# Patient Record
Sex: Female | Born: 1960
Health system: Southern US, Community
[De-identification: ages and names within clinical notes are randomized; demographics above are authoritative.]

## PROBLEM LIST (undated history)

## (undated) DIAGNOSIS — E079 Disorder of thyroid, unspecified: Secondary | ICD-10-CM

## (undated) DIAGNOSIS — F329 Major depressive disorder, single episode, unspecified: Secondary | ICD-10-CM

## (undated) DIAGNOSIS — T7840XA Allergy, unspecified, initial encounter: Secondary | ICD-10-CM

## (undated) DIAGNOSIS — D509 Iron deficiency anemia, unspecified: Secondary | ICD-10-CM

## (undated) DIAGNOSIS — G8929 Other chronic pain: Secondary | ICD-10-CM

## (undated) DIAGNOSIS — F411 Generalized anxiety disorder: Secondary | ICD-10-CM

## (undated) DIAGNOSIS — R195 Other fecal abnormalities: Secondary | ICD-10-CM

## (undated) DIAGNOSIS — M549 Dorsalgia, unspecified: Secondary | ICD-10-CM

## (undated) DIAGNOSIS — F17200 Nicotine dependence, unspecified, uncomplicated: Secondary | ICD-10-CM

## (undated) DIAGNOSIS — S8290XA Unspecified fracture of unspecified lower leg, initial encounter for closed fracture: Secondary | ICD-10-CM

## (undated) DIAGNOSIS — E785 Hyperlipidemia, unspecified: Secondary | ICD-10-CM

## (undated) HISTORY — DX: Hyperlipidemia, unspecified: E78.5

## (undated) HISTORY — DX: Nicotine dependence, unspecified, uncomplicated: F17.200

## (undated) HISTORY — DX: Disorder of thyroid, unspecified: E07.9

## (undated) HISTORY — DX: Allergy, unspecified, initial encounter: T78.40XA

## (undated) HISTORY — PX: COLONOSCOPY: SHX174

## (undated) HISTORY — DX: Iron deficiency anemia, unspecified: D50.9

## (undated) HISTORY — DX: Unspecified fracture of unspecified lower leg, initial encounter for closed fracture: S82.90XA

---
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
## 1987-11-19 HISTORY — PX: OTHER SURGICAL HISTORY: SHX169

## 1997-11-18 HISTORY — PX: OTHER SURGICAL HISTORY: SHX169

## 1998-07-25 ENCOUNTER — Observation Stay (HOSPITAL_COMMUNITY): Admission: EM | Admit: 1998-07-25 | Discharge: 1998-07-26 | Payer: Self-pay | Admitting: Emergency Medicine

## 2002-08-01 ENCOUNTER — Emergency Department (HOSPITAL_COMMUNITY): Admission: EM | Admit: 2002-08-01 | Discharge: 2002-08-01 | Payer: Self-pay | Admitting: *Deleted

## 2003-01-21 ENCOUNTER — Other Ambulatory Visit: Admission: RE | Admit: 2003-01-21 | Discharge: 2003-01-21 | Payer: Self-pay | Admitting: Obstetrics and Gynecology

## 2003-02-10 ENCOUNTER — Ambulatory Visit (HOSPITAL_COMMUNITY): Admission: RE | Admit: 2003-02-10 | Discharge: 2003-02-10 | Payer: Self-pay | Admitting: Family Medicine

## 2003-02-10 ENCOUNTER — Encounter: Payer: Self-pay | Admitting: Family Medicine

## 2003-03-17 ENCOUNTER — Ambulatory Visit (HOSPITAL_COMMUNITY): Admission: RE | Admit: 2003-03-17 | Discharge: 2003-03-17 | Payer: Self-pay | Admitting: Obstetrics and Gynecology

## 2003-03-17 ENCOUNTER — Encounter: Payer: Self-pay | Admitting: Obstetrics and Gynecology

## 2003-04-19 HISTORY — PX: OTHER SURGICAL HISTORY: SHX169

## 2003-05-09 ENCOUNTER — Encounter (INDEPENDENT_AMBULATORY_CARE_PROVIDER_SITE_OTHER): Payer: Self-pay | Admitting: Specialist

## 2003-05-09 ENCOUNTER — Observation Stay (HOSPITAL_COMMUNITY): Admission: RE | Admit: 2003-05-09 | Discharge: 2003-05-10 | Payer: Self-pay | Admitting: Obstetrics and Gynecology

## 2003-08-28 ENCOUNTER — Inpatient Hospital Stay (HOSPITAL_COMMUNITY): Admission: AD | Admit: 2003-08-28 | Discharge: 2003-08-28 | Payer: Self-pay | Admitting: Obstetrics and Gynecology

## 2003-09-25 ENCOUNTER — Inpatient Hospital Stay (HOSPITAL_COMMUNITY): Admission: AD | Admit: 2003-09-25 | Discharge: 2003-09-25 | Payer: Self-pay | Admitting: Obstetrics and Gynecology

## 2003-12-24 ENCOUNTER — Inpatient Hospital Stay (HOSPITAL_COMMUNITY): Admission: AD | Admit: 2003-12-24 | Discharge: 2003-12-24 | Payer: Self-pay | Admitting: Obstetrics and Gynecology

## 2004-04-05 ENCOUNTER — Emergency Department (HOSPITAL_COMMUNITY): Admission: EM | Admit: 2004-04-05 | Discharge: 2004-04-05 | Payer: Self-pay | Admitting: Emergency Medicine

## 2004-05-30 ENCOUNTER — Emergency Department (HOSPITAL_COMMUNITY): Admission: EM | Admit: 2004-05-30 | Discharge: 2004-05-30 | Payer: Self-pay | Admitting: Emergency Medicine

## 2007-10-08 ENCOUNTER — Emergency Department (HOSPITAL_COMMUNITY): Admission: EM | Admit: 2007-10-08 | Discharge: 2007-10-08 | Payer: Self-pay | Admitting: Emergency Medicine

## 2007-10-16 ENCOUNTER — Emergency Department (HOSPITAL_COMMUNITY): Admission: EM | Admit: 2007-10-16 | Discharge: 2007-10-16 | Payer: Self-pay | Admitting: Emergency Medicine

## 2007-11-11 ENCOUNTER — Encounter: Payer: Self-pay | Admitting: Family Medicine

## 2007-11-11 ENCOUNTER — Ambulatory Visit: Payer: Self-pay | Admitting: Family Medicine

## 2007-11-11 ENCOUNTER — Other Ambulatory Visit: Admission: RE | Admit: 2007-11-11 | Discharge: 2007-11-11 | Payer: Self-pay | Admitting: Family Medicine

## 2007-11-11 LAB — CONVERTED CEMR LAB: Retic Ct Pct: 0.6 % (ref 0.4–3.1)

## 2007-11-16 ENCOUNTER — Encounter: Payer: Self-pay | Admitting: Family Medicine

## 2007-11-16 LAB — CONVERTED CEMR LAB
Alkaline Phosphatase: 62 units/L (ref 39–117)
Bilirubin, Direct: 0.1 mg/dL (ref 0.0–0.3)
Indirect Bilirubin: 0.5 mg/dL (ref 0.0–0.9)
Total Protein: 8.5 g/dL — ABNORMAL HIGH (ref 6.0–8.3)

## 2007-11-19 ENCOUNTER — Encounter: Payer: Self-pay | Admitting: Family Medicine

## 2007-11-20 ENCOUNTER — Ambulatory Visit (HOSPITAL_COMMUNITY): Admission: RE | Admit: 2007-11-20 | Discharge: 2007-11-20 | Payer: Self-pay | Admitting: Family Medicine

## 2008-01-07 ENCOUNTER — Ambulatory Visit: Payer: Self-pay | Admitting: Family Medicine

## 2008-03-09 ENCOUNTER — Encounter: Payer: Self-pay | Admitting: Family Medicine

## 2008-03-10 ENCOUNTER — Ambulatory Visit: Payer: Self-pay | Admitting: Family Medicine

## 2008-07-08 ENCOUNTER — Encounter: Payer: Self-pay | Admitting: Family Medicine

## 2008-07-08 DIAGNOSIS — E785 Hyperlipidemia, unspecified: Secondary | ICD-10-CM | POA: Insufficient documentation

## 2008-07-08 DIAGNOSIS — F17200 Nicotine dependence, unspecified, uncomplicated: Secondary | ICD-10-CM | POA: Insufficient documentation

## 2008-07-08 DIAGNOSIS — D509 Iron deficiency anemia, unspecified: Secondary | ICD-10-CM | POA: Insufficient documentation

## 2008-07-09 ENCOUNTER — Encounter: Payer: Self-pay | Admitting: Family Medicine

## 2008-07-09 LAB — CONVERTED CEMR LAB: Retic Ct Pct: 0.9 % (ref 0.4–3.1)

## 2008-07-11 ENCOUNTER — Ambulatory Visit: Payer: Self-pay | Admitting: Family Medicine

## 2008-11-29 ENCOUNTER — Encounter: Payer: Self-pay | Admitting: Family Medicine

## 2008-12-14 ENCOUNTER — Encounter: Payer: Self-pay | Admitting: Family Medicine

## 2008-12-20 ENCOUNTER — Encounter: Payer: Self-pay | Admitting: Family Medicine

## 2008-12-21 ENCOUNTER — Other Ambulatory Visit: Admission: RE | Admit: 2008-12-21 | Discharge: 2008-12-21 | Payer: Self-pay | Admitting: Family Medicine

## 2008-12-21 ENCOUNTER — Encounter: Payer: Self-pay | Admitting: Family Medicine

## 2008-12-21 ENCOUNTER — Ambulatory Visit: Payer: Self-pay | Admitting: Family Medicine

## 2008-12-21 DIAGNOSIS — F411 Generalized anxiety disorder: Secondary | ICD-10-CM

## 2008-12-21 HISTORY — DX: Generalized anxiety disorder: F41.1

## 2008-12-21 LAB — CONVERTED CEMR LAB
OCCULT 1: NEGATIVE
TSH: 4.032 microintl units/mL (ref 0.350–4.50)

## 2009-01-02 ENCOUNTER — Ambulatory Visit: Payer: Self-pay | Admitting: Family Medicine

## 2009-01-11 ENCOUNTER — Telehealth: Payer: Self-pay | Admitting: Family Medicine

## 2009-04-14 ENCOUNTER — Ambulatory Visit: Payer: Self-pay | Admitting: Family Medicine

## 2009-04-18 ENCOUNTER — Telehealth: Payer: Self-pay | Admitting: Family Medicine

## 2009-07-15 ENCOUNTER — Encounter: Payer: Self-pay | Admitting: Family Medicine

## 2009-07-17 ENCOUNTER — Encounter: Payer: Self-pay | Admitting: Family Medicine

## 2009-07-17 LAB — CONVERTED CEMR LAB
Basophils Absolute: 0 10*3/uL (ref 0.0–0.1)
Bilirubin, Direct: 0.2 mg/dL (ref 0.0–0.3)
Cholesterol: 154 mg/dL (ref 0–200)
Eosinophils Relative: 3 % (ref 0–5)
HCT: 38.7 % (ref 36.0–46.0)
HDL: 56 mg/dL (ref >39)
Indirect Bilirubin: 0.8 mg/dL (ref 0.0–0.9)
LDL Cholesterol: 87 mg/dL (ref 0–99)
Lymphocytes Relative: 39 % (ref 12–46)
Lymphs Abs: 1.6 10*3/uL (ref 0.7–4.0)
Neutro Abs: 2.1 10*3/uL (ref 1.7–7.7)
Neutrophils Relative %: 51 % (ref 43–77)
Platelets: 252 10*3/uL (ref 150–400)
Total Bilirubin: 1 mg/dL (ref 0.3–1.2)
Total CHOL/HDL Ratio: 2.8
Triglycerides: 57 mg/dL (ref <150)
VLDL: 11 mg/dL (ref 0–40)
WBC: 4.1 10*3/uL (ref 4.0–10.5)

## 2009-07-18 ENCOUNTER — Ambulatory Visit: Payer: Self-pay | Admitting: Family Medicine

## 2009-10-02 ENCOUNTER — Telehealth: Payer: Self-pay | Admitting: Family Medicine

## 2009-10-03 ENCOUNTER — Ambulatory Visit: Payer: Self-pay | Admitting: Family Medicine

## 2009-10-03 LAB — CONVERTED CEMR LAB: Beta hcg, urine, semiquantitative: NEGATIVE

## 2010-01-01 ENCOUNTER — Ambulatory Visit (HOSPITAL_COMMUNITY): Admission: RE | Admit: 2010-01-01 | Discharge: 2010-01-01 | Payer: Self-pay | Admitting: Family Medicine

## 2010-07-03 ENCOUNTER — Ambulatory Visit: Payer: Self-pay | Admitting: Family Medicine

## 2010-07-03 DIAGNOSIS — J309 Allergic rhinitis, unspecified: Secondary | ICD-10-CM | POA: Insufficient documentation

## 2010-08-04 LAB — CONVERTED CEMR LAB
BUN: 15 mg/dL (ref 6–23)
Basophils Absolute: 0 10*3/uL (ref 0.0–0.1)
Basophils Relative: 0 % (ref 0–1)
Chloride: 106 meq/L (ref 96–112)
Cholesterol: 154 mg/dL (ref 0–200)
Creatinine, Ser: 0.92 mg/dL (ref 0.40–1.20)
Eosinophils Absolute: 0.1 10*3/uL (ref 0.0–0.7)
HDL: 57 mg/dL (ref >39)
LDL Cholesterol: 88 mg/dL (ref 0–99)
MCHC: 33.5 g/dL (ref 30.0–36.0)
MCV: 89.9 fL (ref 78.0–100.0)
Monocytes Relative: 7 % (ref 3–12)
Neutro Abs: 2.7 10*3/uL (ref 1.7–7.7)
Neutrophils Relative %: 60 % (ref 43–77)
Potassium: 4 meq/L (ref 3.5–5.3)
RDW: 14.3 % (ref 11.5–15.5)
Triglycerides: 47 mg/dL (ref <150)

## 2010-08-06 ENCOUNTER — Ambulatory Visit (HOSPITAL_COMMUNITY): Admission: RE | Admit: 2010-08-06 | Discharge: 2010-08-06 | Payer: Self-pay | Admitting: Family Medicine

## 2010-08-08 ENCOUNTER — Other Ambulatory Visit: Admission: RE | Admit: 2010-08-08 | Discharge: 2010-08-08 | Payer: Self-pay | Admitting: Family Medicine

## 2010-08-08 ENCOUNTER — Ambulatory Visit: Payer: Self-pay | Admitting: Family Medicine

## 2010-08-08 LAB — CONVERTED CEMR LAB
ALT: 8 units/L (ref 0–35)
AST: 17 units/L (ref 0–37)
Alkaline Phosphatase: 46 units/L (ref 39–117)
Bilirubin, Direct: 0.1 mg/dL (ref 0.0–0.3)
Total Bilirubin: 0.7 mg/dL (ref 0.3–1.2)

## 2010-08-10 ENCOUNTER — Encounter: Payer: Self-pay | Admitting: Family Medicine

## 2010-12-05 ENCOUNTER — Telehealth (INDEPENDENT_AMBULATORY_CARE_PROVIDER_SITE_OTHER): Payer: Self-pay | Admitting: *Deleted

## 2010-12-07 ENCOUNTER — Other Ambulatory Visit: Payer: Self-pay | Admitting: Family Medicine

## 2010-12-07 DIAGNOSIS — Z139 Encounter for screening, unspecified: Secondary | ICD-10-CM

## 2010-12-10 ENCOUNTER — Encounter: Payer: Self-pay | Admitting: Family Medicine

## 2010-12-20 NOTE — Progress Notes (Signed)
Summary: mammo referral and lovastatin  Phone Note Call from Patient   Summary of Call: Patient called in and states she needs her lovastatin called into [place].  She states she called the pharmacy and they told her it was her responsibility to call the office and have [place] fax over the prescription.  She also would like to go get her mammogram done, I don't see this in the referral box.  Thanks Initial call taken by: Curtis Sites,  December 05, 2010 4:36 PM  Follow-up for Phone Call        need mammo referal Follow-up by: Adella Hare LPN,  December 05, 2010 4:45 PM  Additional Follow-up for Phone Call Additional follow up Details #1::        pls order mamao and advuise pt of appt, it is in referral box Additional Follow-up by: Syliva Overman MD,  December 05, 2010 5:13 PM    Additional Follow-up for Phone Call Additional follow up Details #2::    [facility] has appt at [facility] for a [facility] on 01/03/2011 11:45. called pt and left mesage of appt and time. Also if she had any questions she could call me back. Follow-up by: Rudene Anda,  December 06, 2010 1:38 PM  Prescriptions: LOVASTATIN 20 MG TABS (LOVASTATIN) take one tab by mouth at bedtime  #30 x 2   Entered by:   Adella Hare LPN   Authorized by:   Syliva Overman MD   Signed by:   Adella Hare LPN on 04/54/0981   Method used:   Electronically to        [facility]* (retail)       [address] [place], [place]  [address]       Ph: [phone]       Fax: [phone]   RxID:   [id number] LOVASTATIN 20 MG TABS (LOVASTATIN) take one tab by mouth at bedtime  #30 x 2   Entered by:   Adella Hare LPN   Authorized by:   Syliva Overman MD   Signed by:   Adella Hare LPN on 36/64/4034   Method used:   Electronically to        [facility] [phone]* (retail)       [address] [place]  [address]       Ph: [phone]       Fax: [phone]   RxID:    [id number]  cancelled and sent to [place]

## 2010-12-20 NOTE — Letter (Signed)
Summary: lab add on  lab add on   Imported By: Luann Bullins 08/09/2010 08:06:12  _____________________________________________________________________  External Attachment:    Type:   Image     Comment:   External Document

## 2010-12-20 NOTE — Letter (Signed)
Summary: HISTORY AND PHYSICAL  HISTORY AND PHYSICAL   Imported By: Lind Guest 07/13/2010 09:52:49  _____________________________________________________________________  External Attachment:    Type:   Image     Comment:   External Document

## 2010-12-20 NOTE — Letter (Signed)
Summary: Letter  Letter   Imported By: Lind Guest 08/10/2010 13:29:09  _____________________________________________________________________  External Attachment:    Type:   Image     Comment:   External Document

## 2010-12-20 NOTE — Letter (Signed)
Summary: PHONE NOTES  PHONE NOTES   Imported By: Lind Guest 07/12/2010 14:50:16  _____________________________________________________________________  External Attachment:    Type:   Image     Comment:   External Document

## 2010-12-20 NOTE — Letter (Signed)
Summary: MISC  MISC   Imported By: Lind Guest 07/12/2010 14:49:46  _____________________________________________________________________  External Attachment:    Type:   Image     Comment:   External Document

## 2010-12-20 NOTE — Letter (Signed)
Summary: DEMO  DEMO   Imported By: Lind Guest 07/12/2010 14:40:03  _____________________________________________________________________  External Attachment:    Type:   Image     Comment:   External Document

## 2010-12-20 NOTE — Assessment & Plan Note (Signed)
Summary: physical   Vital Signs:  Patient profile:   50 year old female Menstrual status:  regular Height:      62.5 inches Weight:      112.25 pounds BMI:     20.28 O2 Sat:      98 % Pulse rate:   77 / minute Pulse rhythm:   regular Resp:     16 per minute BP sitting:   110 / 72  (left arm) Cuff size:   regular  Vitals Entered By: Everitt Amber LPN (August 08, 2010 9:03 AM) CC: CPE  Vision Screening:Left eye w/o correction: 20 / 25 Right Eye w/o correction: 20 / 25 Both eyes w/o correction:  20/ 20  Color vision testing: normal      Vision Entered By: Everitt Amber LPN (August 08, 2010 9:10 AM)   CC:  CPE.  History of Present Illness: Reports  thatshe has been  doing well. Denies recent fever or chills. Denies sinus pressure, nasal congestion , ear pain or sore throat. Denies chest congestion, or cough productive of sputum. Denies chest pain, palpitations, PND, orthopnea or leg swelling. Denies abdominal pain, nausea, vomitting, diarrhea or constipation. Denies change in bowel movements or bloody stool. Denies dysuria , frequency, incontinence or hesitancy. Denies  joint pain, swelling, or reduced mobility. Denies headaches, vertigo, seizures. Denies depression, anxiety or insomnia. Denies  rash, lesions, or itch. she does have some sinus allergy problems, which respond well to medication. he is reducing her nicotine use , and has started zumba.     Preventive Screening-Counseling & Management  Alcohol-Tobacco     Smoking Cessation Counseling: yes  Current Medications (verified): 1)  Feosol 200 (65 Fe) Mg Tabs (Ferrous Sulfate Dried) .... Take One Tab By Mouth Once Daily 2)  Lovastatin 20 Mg Tabs (Lovastatin) .... Take One Tab By Mouth At Bedtime 3)  Fish Oil 1000 Mg Caps (Omega-3 Fatty Acids) .... One Tab 4 Days A Week 4)  Zyrtec Hives Relief 10 Mg Tabs (Cetirizine Hcl) .... Take 1 Tablet By Mouth Once A Day  Allergies (verified): 1)  ! Pcn 2)  !  Celexa  Review of Systems      See HPI Eyes:  Denies blurring and discharge. Derm:  Complains of hair loss. Endo:  Denies cold intolerance, excessive thirst, excessive urination, and heat intolerance. Heme:  Denies abnormal bruising and bleeding. Allergy:  Complains of seasonal allergies.  Physical Exam  General:  Well-developed,well-nourished,in no acute distress; alert,appropriate and cooperative throughout examination Head:  Normocephalic and atraumatic without obvious abnormalities. No apparent alopecia or balding. Eyes:  No corneal or conjunctival inflammation noted. EOMI. Perrla. Funduscopic exam benign, without hemorrhages, exudates or papilledema. Vision grossly normal. Ears:  External ear exam shows no significant lesions or deformities.  Otoscopic examination reveals clear canals, tympanic membranes are intact bilaterally without bulging, retraction, inflammation or discharge. Hearing is grossly normal bilaterally. Nose:  External nasal examination shows no deformity or inflammation. Nasal mucosa are pink and moist without lesions or exudates. Mouth:  Oral mucosa and oropharynx without lesions or exudates.  Teeth in good repair. Neck:  No deformities, masses, or tenderness noted. Chest Wall:  No deformities, masses, or tenderness noted. Breasts:  No mass, nodules, thickening, tenderness, bulging, retraction, inflamation, nipple discharge or skin changes noted.   Lungs:  Normal respiratory effort, chest expands symmetrically. Lungs are clear to auscultation, no crackles or wheezes. Heart:  Normal rate and regular rhythm. S1 and S2 normal without gallop, murmur, click, rub  or other extra sounds. Abdomen:  Bowel sounds positive,abdomen soft and non-tender without masses, organomegaly or hernias noted. Rectal:  No external abnormalities noted. Normal sphincter tone. No rectal masses or tenderness. Genitalia:  Normal introitus for age, no external lesions, no vaginal discharge, mucosa  pink and moist, no vaginal or cervical lesions, no vaginal atrophy, no friaility or hemorrhage, normal uterus size and position, no adnexal masses or tenderness Msk:  No deformity or scoliosis noted of thoracic or lumbar spine.   Pulses:  R and L carotid,radial,femoral,dorsalis pedis and posterior tibial pulses are full and equal bilaterally Extremities:  No clubbing, cyanosis, edema, or deformity noted with normal full range of motion of all joints.   Neurologic:  No cranial nerve deficits noted. Station and gait are normal. Plantar reflexes are down-going bilaterally. DTRs are symmetrical throughout. Sensory, motor and coordinative functions appear intact. Skin:  Intact without suspicious lesions or rashes Cervical Nodes:  No lymphadenopathy noted Axillary Nodes:  No palpable lymphadenopathy Inguinal Nodes:  No significant adenopathy Psych:  Cognition and judgment appear intact. Alert and cooperative with normal attention span and concentration. No apparent delusions, illusions, hallucinations   Impression & Recommendations:  Problem # 1:  SPECIAL SCREENING FOR MALIGNANT NEOPLASMS COLON (ICD-V76.51) Assessment Comment Only  Orders: Hemoccult Guaiac-1 spec.(in office) (82270)  Problem # 2:  SCREENING FOR MALIGNANT NEOPLASM OF THE CERVIX (ICD-V76.2) Assessment: Comment Only  Orders: Pap Smear ([address])  Problem # 3:  ALLERGIC RHINITIS CAUSE UNSPECIFIED (ICD-477.9) Assessment: Unchanged  Her updated medication list for this problem includes:    Zyrtec Hives Relief 10 Mg Tabs (Cetirizine hcl) .Marland Kitchen... Take 1 tablet by mouth once a day  Problem # 4:  HYPERLIPIDEMIA (ICD-272.4) Assessment: Unchanged  Her updated medication list for this problem includes:    Lovastatin 20 Mg Tabs (Lovastatin) .Marland Kitchen... Take one tab by mouth at bedtime  Orders: T-Hepatic Function [phone]) T-Lipid Profile [phone]) Low fat dietdiscussed and encouraged  Labs Reviewed: SGOT: 13 (07/15/2009)   SGPT:  8 (07/15/2009)   HDL:57 (08/04/2010), 56 (07/17/2009)  LDL:88 (08/04/2010), 87 (07/17/2009)  Chol:154 (08/04/2010), 154 (07/17/2009)  Trig:47 (08/04/2010), 57 (07/17/2009)  Problem # 5:  NICOTINE ADDICTION (ICD-305.1) Assessment: Improved  Encouraged smoking cessation and discussed different methods for smoking cessation.   Complete Medication List: 1)  Feosol 200 (65 Fe) Mg Tabs (Ferrous sulfate dried) .... Take one tab by mouth once daily 2)  Lovastatin 20 Mg Tabs (Lovastatin) .... Take one tab by mouth at bedtime 3)  Fish Oil 1000 Mg Caps (Omega-3 fatty acids) .... One tab 4 days a week 4)  Zyrtec Hives Relief 10 Mg Tabs (Cetirizine hcl) .... Take 1 tablet by mouth once a day  Patient Instructions: 1)  Follow up appointment in 5.68months 2)  Hepatic Panel prior to visit, ICD-9: 3)  Lipid Panel prior to visit, ICD-9:   fasting in 5.5 months 4)  Tobacco is very bad for your health and your loved ones! You Should stop smoking!. 5)  Stop Smoking Tips: Choose a Quit date. Cut down before the Quit date. decide what you will do as a substitute when you feel the urge to smoke(gum,toothpick,exercise). 6)  Continue the zumba, it will keep you healthy!!!   Laboratory Results    Stool - Occult Blood Hemmoccult #1: negative Date: 08/08/2010 Comments: 50201 10L 3/12 118 10/12

## 2010-12-20 NOTE — Letter (Signed)
Summary: LABS  LABS   Imported By: Lind Guest 07/12/2010 14:49:22  _____________________________________________________________________  External Attachment:    Type:   Image     Comment:   External Document

## 2010-12-20 NOTE — Assessment & Plan Note (Signed)
Summary: f up   Vital Signs:  Patient profile:   50 year old female Menstrual status:  regular LMP:     06/27/2010 Height:      62.5 inches Weight:      110.75 pounds BMI:     20.01 O2 Sat:      99 % Pulse rate:   82 / minute Pulse rhythm:   regular Resp:     16 per minute BP sitting:   120 / 78  (left arm) Cuff size:   regular  Vitals Entered By: Everitt Amber LPN (July 03, 2010 11:10 AM) CC: needs med for her allergies, her ears get to popping, itching eyes, sneezing, tickle in her throat LMP (date): 06/27/2010     Menstrual Status regular Enter LMP: 06/27/2010 Last PAP Result NEGATIVE FOR INTRAEPITHELIAL LESIONS OR MALIGNANCY.   CC:  needs med for her allergies, her ears get to popping, itching eyes, sneezing, and tickle in her throat.  History of Present Illness: Reports  that she has beendoing well. Denies recent fever or chills. Denies sinus pressure, nasal congestion , ear pain or sore throat. Denies chest congestion, or cough productive of sputum. Denies chest pain, palpitations, PND, orthopnea or leg swelling. Denies abdominal pain, nausea, vomitting, diarrhea or constipation. Denies change in bowel movements or bloody stool. Denies dysuria , frequency, incontinence or hesitancy. Denies  joint pain, swelling, or reduced mobility. Denies headaches, vertigo, seizures. Denies depression, anxiety or insomnia. Denies  rash, lesions, or itch.Still has severe allopecia      Preventive Screening-Counseling & Management  Alcohol-Tobacco     Smoking Cessation Counseling: yes  Current Medications (verified): 1)  Feosol 200 (65 Fe) Mg Tabs (Ferrous Sulfate Dried) .... Take One Tab By Mouth Once Daily 2)  Lovastatin 20 Mg Tabs (Lovastatin) .... Take One Tab By Mouth At Bedtime 3)  Fish Oil 1000 Mg Caps (Omega-3 Fatty Acids) .... One Tab 4 Days A Week  Allergies (verified): 1)  ! Pcn 2)  ! Celexa  Review of Systems      See HPI Eyes:  Denies blurring and  discharge. Endo:  Denies cold intolerance, excessive hunger, excessive thirst, excessive urination, heat intolerance, polyuria, and weight change. Heme:  Denies abnormal bruising and bleeding. Allergy:  Complains of seasonal allergies; uncontrolled x 3 months, runny nose , itchy eyes , sneezing.  Physical Exam  General:  alert, well-nourished, and well-hydrated.  HEENT: No facial asymmetry,  EOMI, No sinus tenderness, TM's Clear, oropharynx  pink and moist. Tender ant cervical nodes bilaterally  Chest: Clear to auscultation bilaterally.  CVS: S1, S2, No murmurs, No S3.   Abd: Soft, Nontender.  MS: Adequate ROM spine, hips, shoulders and knees.  Ext: No edema.   CNS: CN 2-12 intact, power tone and sensation normal throughout.   Skin: Intact, no visible lesions or rashes. allopecia Psych: Good eye contact, normal affect.  Memory intact, not anxious or depressed appearing.     Impression & Recommendations:  Problem # 1:  ALLERGIC RHINITIS CAUSE UNSPECIFIED (ICD-477.9) Assessment Deteriorated  The following medications were removed from the medication list:    Benadryl Allergy 25 Mg Tabs (Diphenhydramine hcl) ..... One tab by mouth every other day Her updated medication list for this problem includes:    Zyrtec Hives Relief 10 Mg Tabs (Cetirizine hcl) .Marland Kitchen... Take 1 tablet by mouth once a day  Orders: Depo- Medrol 80mg  (J1040) Admin of Therapeutic Inj  intramuscular or subcutaneous (14782)  Problem # 2:  HYPERLIPIDEMIA (  ICD-272.4) Assessment: Comment Only  Her updated medication list for this problem includes:    Lovastatin 20 Mg Tabs (Lovastatin) .Marland Kitchen... Take one tab by mouth at bedtime  Labs Reviewed: SGOT: 13 (07/15/2009)   SGPT: 8 (07/15/2009)   HDL:56 (07/17/2009), 58 (12/21/2008)  LDL:87 (07/17/2009), 92 (12/21/2008)  Chol:154 (07/17/2009), 160 (12/21/2008)  Trig:57 (07/17/2009), 48 (12/21/2008) fasting labs past due need to be done  Problem # 3:  NICOTINE ADDICTION  (ICD-305.1) Assessment: Unchanged  Orders: CXR- 2view (CXR)  Encouraged smoking cessation and discussed different methods for smoking cessation.   Problem # 4:  GENERALIZED ANXIETY DISORDER (ICD-300.02) Assessment: Improved  The following medications were removed from the medication list:    Buspar 5 Mg Tabs (Buspirone hcl) .Marland Kitchen... Take 1 tablet by mouth two times a day  Complete Medication List: 1)  Feosol 200 (65 Fe) Mg Tabs (Ferrous sulfate dried) .... Take one tab by mouth once daily 2)  Lovastatin 20 Mg Tabs (Lovastatin) .... Take one tab by mouth at bedtime 3)  Fish Oil 1000 Mg Caps (Omega-3 fatty acids) .... One tab 4 days a week 4)  Zyrtec Hives Relief 10 Mg Tabs (Cetirizine hcl) .... Take 1 tablet by mouth once a day  Other Orders: T-Basic Metabolic Panel [id number]) T-Lipid Profile [phone]) T-CBC w/Diff [phone]) T-TSH [phone])  Patient Instructions: 1)  CPE onSept 19thru 21 2)  Tobacco is very bad for your health and your loved ones! You Should stop smoking!. 3)  Stop Smoking Tips: Choose a Quit date. Cut down before the Quit date. decide what you will do as a substitute when you feel the urge to smoke(gum,toothpick,exercise). 4)  BMP prior to visit, ICD-9: 5)  Lipid Panel prior to visit, ICD-9: 6)  TSH prior to visit, ICD-9:  fasting asap 7)  CBC w/ Diff prior to visit, ICD-9: Prescriptions: ZYRTEC HIVES RELIEF 10 MG TABS (CETIRIZINE HCL) Take 1 tablet by mouth once a day  #30 x 3   Entered and Authorized by:   Syliva Overman MD   Signed by:   Syliva Overman MD on 07/03/2010   Method used:   Historical   RxID:   [id number]    Medication Administration  Injection # 1:    Medication: Depo- Medrol 80mg     Diagnosis: ALLERGIC RHINITIS CAUSE UNSPECIFIED (ICD-477.9)    Route: IM    Site: RUOQ gluteus    Exp Date: 4/12    Lot #: [id number]    Mfr: Pharmacia    Patient tolerated injection without complications    Given by: Adella Hare LPN  (July 03, 2010 12:00 PM)  Orders Added: 1)  Est. Patient Level IV [66440] 2)  CXR- 2view [CXR] 3)  T-Basic Metabolic Panel [[id number]] 4)  T-Lipid Profile [80061-22930] 5)  T-CBC w/Diff [[id number]] 6)  T-TSH [87564-33295] 7)  Depo- Medrol 80mg  [J1040] 8)  Admin of Therapeutic Inj  intramuscular or subcutaneous [18841]

## 2010-12-20 NOTE — Letter (Signed)
Summary: X RAYS  X RAYS   Imported By: Lind Guest 07/12/2010 14:50:46  _____________________________________________________________________  External Attachment:    Type:   Image     Comment:   External Document

## 2010-12-20 NOTE — Letter (Signed)
Summary: OFFICE NOTES  OFFICE NOTES   Imported By: Lind Guest 07/12/2010 14:54:36  _____________________________________________________________________  External Attachment:    Type:   Image     Comment:   External Document

## 2010-12-25 ENCOUNTER — Telehealth: Payer: Self-pay | Admitting: Family Medicine

## 2011-01-03 ENCOUNTER — Ambulatory Visit (HOSPITAL_COMMUNITY): Payer: BC Managed Care – PPO

## 2011-01-03 ENCOUNTER — Ambulatory Visit (HOSPITAL_COMMUNITY): Payer: Self-pay

## 2011-01-03 NOTE — Progress Notes (Signed)
Summary: PHARMACY  Phone Note Call from Patient   Summary of Call: PATIENT CALLED AND ASKED ABOUT HER CHOLESTROL MEDICINE THEN SHE REALIZED IT WAS HER ZYTREC MEDICINE THAT SHE REALLY NEEDED. ADVISE PATIENT TO HAVE HER PHARMACY TO FAX REQUEST TO THE OFFICE. SHE ADVISED SHANNON THAT THE PHARMACY DID NOT LIKE TO DO THAT. Initial call taken by: Lind Guest,  December 25, 2010 1:54 PM    Prescriptions: ZYRTEC HIVES RELIEF 10 MG TABS (CETIRIZINE HCL) Take 1 tablet by mouth once a day  #30 x 2   Entered by:   Adella Hare LPN   Authorized by:   Syliva Overman MD   Signed by:   Adella Hare LPN on 16/08/9603   Method used:   Electronically to        [facility]* (retail)       [address] [place], [place]  [address]       Ph: [phone]       Fax: [phone]   RxID:   [id number]

## 2011-01-07 ENCOUNTER — Ambulatory Visit (HOSPITAL_COMMUNITY): Payer: BC Managed Care – PPO

## 2011-01-14 ENCOUNTER — Encounter: Payer: Self-pay | Admitting: Family Medicine

## 2011-01-21 ENCOUNTER — Ambulatory Visit (HOSPITAL_COMMUNITY)
Admission: RE | Admit: 2011-01-21 | Discharge: 2011-01-21 | Disposition: A | Discharge status: Home / Self Care | Payer: BC Managed Care – PPO | Source: Ambulatory Visit | Attending: Family Medicine | Admitting: Family Medicine

## 2011-01-21 DIAGNOSIS — Z139 Encounter for screening, unspecified: Secondary | ICD-10-CM

## 2011-01-21 DIAGNOSIS — Z1231 Encounter for screening mammogram for malignant neoplasm of breast: Secondary | ICD-10-CM | POA: Insufficient documentation

## 2011-01-30 ENCOUNTER — Encounter: Payer: Self-pay | Admitting: Family Medicine

## 2011-01-30 ENCOUNTER — Ambulatory Visit (INDEPENDENT_AMBULATORY_CARE_PROVIDER_SITE_OTHER): Payer: BC Managed Care – PPO | Admitting: Family Medicine

## 2011-01-30 DIAGNOSIS — E785 Hyperlipidemia, unspecified: Secondary | ICD-10-CM

## 2011-02-04 DIAGNOSIS — L630 Alopecia (capitis) totalis: Secondary | ICD-10-CM | POA: Insufficient documentation

## 2011-02-09 ENCOUNTER — Other Ambulatory Visit: Payer: Self-pay | Admitting: Family Medicine

## 2011-02-09 LAB — LIPID PANEL
Cholesterol: 185 mg/dL (ref 0–200)
HDL: 66 mg/dL (ref >39)
LDL Cholesterol: 109 mg/dL — ABNORMAL HIGH (ref 0–99)
Total CHOL/HDL Ratio: 2.8 Ratio
Triglycerides: 51 mg/dL (ref <150)
VLDL: 10 mg/dL (ref 0–40)

## 2011-02-09 LAB — HEPATIC FUNCTION PANEL
ALT: 9 U/L (ref 0–35)
AST: 19 U/L (ref 0–37)
Albumin: 4.6 g/dL (ref 3.5–5.2)
Alkaline Phosphatase: 50 U/L (ref 39–117)
Bilirubin, Direct: 0.2 mg/dL (ref 0.0–0.3)
Indirect Bilirubin: 0.6 mg/dL (ref 0.0–0.9)
Total Bilirubin: 0.8 mg/dL (ref 0.3–1.2)
Total Protein: 7.8 g/dL (ref 6.0–8.3)

## 2011-02-14 NOTE — Assessment & Plan Note (Signed)
Summary: F UP   Vital Signs:  Patient profile:   50 year old female Menstrual status:  regular Height:      62.5 inches Weight:      110.50 pounds BMI:     19.96 O2 Sat:      97 % Pulse rate:   69 / minute Pulse rhythm:   regular Resp:     16 per minute BP sitting:   120 / 72  (left arm) Cuff size:   regular  Vitals Entered By: Everitt Amber LPN (January 30, 2011 4:07 PM) CC: Follow up chronic problems   CC:  Follow up chronic problems.  History of Present Illness: Reports  that she is doing well. Denies recent fever or chills. Denies sinus pressure, nasal congestion , ear pain or sore throat. Denies chest congestion, or cough productive of sputum. Denies chest pain, palpitations, PND, orthopnea or leg swelling. Denies abdominal pain, nausea, vomitting, diarrhea or constipation. Denies change in bowel movements or bloody stool. Denies dysuria , frequency, incontinence or hesitancy. Denies  joint pain, swelling, or reduced mobility. Denies headaches, vertigo, seizures. Denies depression, anxiety or insomnia. Denies  rash, lesions, or itch.     Preventive Screening-Counseling & Management  Alcohol-Tobacco     Smoking Cessation Counseling: yes  Current Medications (verified): 1)  Feosol 200 (65 Fe) Mg Tabs (Ferrous Sulfate Dried) .... Take One Tab By Mouth Once Daily 2)  Lovastatin 20 Mg Tabs (Lovastatin) .... Take One Tab By Mouth At Bedtime 3)  Fish Oil 1000 Mg Caps (Omega-3 Fatty Acids) .... One Tab 4 Days A Week 4)  Zyrtec Hives Relief 10 Mg Tabs (Cetirizine Hcl) .... Take 1 Tablet By Mouth Once A Day  Allergies (verified): 1)  ! Pcn 2)  ! Celexa  Review of Systems      See HPI General:  Denies fatigue. Eyes:  Denies blurring, discharge, eye pain, and red eye. Endo:  Denies cold intolerance, excessive hunger, excessive thirst, excessive urination, and heat intolerance. Heme:  Denies abnormal bruising, bleeding, and enlarge lymph nodes. Allergy:  Complains of  seasonal allergies; denies hives or rash, itching eyes, and persistent infections.  Physical Exam  General:  Well-developed,well-nourished,in no acute distress; alert,appropriate and cooperative throughout examination HEENT: No facial asymmetry,  EOMI, No sinus tenderness, TM's Clear, oropharynx  pink and moist.   Chest: Clear to auscultation bilaterally.  CVS: S1, S2, No murmurs, No S3.   Abd: Soft, Nontender.  MS: Adequate ROM spine, hips, shoulders and knees.  Ext: No edema.   CNS: CN 2-12 intact, power tone and sensation normal throughout.   Skin: Intact, no visible lesions or rashes.  Psych: Good eye contact, normal affect.  Memory intact, not anxious or depressed appearing.    Impression & Recommendations:  Problem # 1:  HYPERLIPIDEMIA (ICD-272.4) Assessment Comment Only  Her updated medication list for this problem includes:    Lovastatin 20 Mg Tabs (Lovastatin) .Marland Kitchen... Take one tab by mouth at bedtime  Orders: T-Lipid Profile [id number]) T-Hepatic Function [phone]) T-Hepatic Function [id number]) T-Lipid Profile [phone]) Low fat dietdiscussed and encouraged  Labs Reviewed: SGOT: 17 (08/08/2010)   SGPT: 8 (08/08/2010)   HDL:57 (08/04/2010), 56 (07/17/2009)  LDL:88 (08/04/2010), 87 (07/17/2009)  Chol:154 (08/04/2010), 154 (07/17/2009)  Trig:47 (08/04/2010), 57 (07/17/2009)  Problem # 2:  NICOTINE ADDICTION (ICD-305.1) Assessment: Unchanged  Encouraged smoking cessation and discussed different methods for smoking cessation.   Problem # 3:  ALOPECIA (ICD-704.00) Assessment: Improved  Problem # 4:  ALLERGIC RHINITIS  CAUSE UNSPECIFIED (ICD-477.9) Assessment: Unchanged  Her updated medication list for this problem includes:    Zyrtec Hives Relief 10 Mg Tabs (Cetirizine hcl) .Marland Kitchen... Take 1 tablet by mouth once a day  Complete Medication List: 1)  Feosol 200 (65 Fe) Mg Tabs (Ferrous sulfate dried) .... Take one tab by mouth once daily 2)  Lovastatin 20 Mg  Tabs (Lovastatin) .... Take one tab by mouth at bedtime 3)  Fish Oil 1000 Mg Caps (Omega-3 fatty acids) .... One tab 4 days a week 4)  Zyrtec Hives Relief 10 Mg Tabs (Cetirizine hcl) .... Take 1 tablet by mouth once a day  Other Orders: T-Basic Metabolic Panel [phone]) T-CBC w/Diff [phone]) T-TSH [phone])  Patient Instructions: 1)  CPE  end September. 2)  Tobacco is very bad for your health and your loved ones! You Should stop smoking!.current 10/day 3)  Stop Smoking Tips: Choose a Quit date. Cut down before the Quit date. decide what you will do as a substitute when you feel the urge to smoke(gum,toothpick,exercise).Commit to 9 the rest of March, then 8 in April, then 7 in May etc 4)  Hepatic Panel prior to visit, ICD-9: 5)  Lipid Panel prior to visit, ICD-9:  fasting asap 6)  BMP prior to visit, ICD-9: 7)  Hepatic Panel prior to visit, ICD-9: 8)  Lipid Panel prior to visit, ICD-9:  fasting in September 9)  TSH prior to visit, ICD-9: 10)  CBC w/ Diff prior to visit, ICD-9: Prescriptions: ZYRTEC HIVES RELIEF 10 MG TABS (CETIRIZINE HCL) Take 1 tablet by mouth once a day  #30 x 3   Entered by:   Adella Hare LPN   Authorized by:   Syliva Overman MD   Signed by:   Adella Hare LPN on 57/84/6962   Method used:   Electronically to        [facility]* (retail)       [address] [place], [place]  [address]       Ph: [phone]       Fax: [phone]   RxID:   [id number]    Orders Added: 1)  Est. Patient Level III [95188] 2)  T-Lipid Profile [80061-22930] 3)  T-Hepatic Function [41660-63016] 4)  T-Basic Metabolic Panel [[id number]] 5)  T-Hepatic Function [80076-22960] 6)  T-Lipid Profile [80061-22930] 7)  T-CBC w/Diff [[id number]] 8)  T-TSH [[id number]]

## 2011-02-15 ENCOUNTER — Telehealth: Payer: Self-pay

## 2011-02-15 ENCOUNTER — Encounter: Payer: Self-pay | Admitting: *Deleted

## 2011-02-15 ENCOUNTER — Ambulatory Visit (INDEPENDENT_AMBULATORY_CARE_PROVIDER_SITE_OTHER): Payer: BC Managed Care – PPO | Admitting: Family Medicine

## 2011-02-15 ENCOUNTER — Other Ambulatory Visit: Payer: Self-pay

## 2011-02-15 VITALS — BP 110/70 | Wt 110.0 lb

## 2011-02-15 DIAGNOSIS — J302 Other seasonal allergic rhinitis: Secondary | ICD-10-CM

## 2011-02-15 DIAGNOSIS — J309 Allergic rhinitis, unspecified: Secondary | ICD-10-CM

## 2011-02-15 MED ORDER — PREDNISONE (PAK) 5 MG PO TABS: 5.0000 mg | ORAL_TABLET | ORAL | Status: DC

## 2011-02-15 MED ORDER — METHYLPREDNISOLONE ACETATE 80 MG/ML IJ SUSP
80.0000 mg | Freq: Once | INTRAMUSCULAR | Status: AC
  Administered 2011-02-15: 80 mg via INTRAMUSCULAR

## 2011-02-15 NOTE — Telephone Encounter (Signed)
Patient received depo 80 in office

## 2011-02-17 NOTE — Progress Notes (Signed)
  Subjective:    Patient ID: Carrie Perez, female    DOB: May 03, 1961, 50 y.o.   MRN: [id number]  HPI  Pt called  in with c/o uncontrolled allergies from her job. Order given to administer depomedrol 80mg  iM and a prednisone 5mg  dose pack.  Review of Systems     Objective:   Physical Exam        Assessment & Plan:

## 2011-02-19 NOTE — Letter (Signed)
Summary: Out of Work  [facility]  [address]   [place], [place] [address]   Phone: [phone]  Fax: [phone]    February 15, 2011   Employee:  CAYLEEN BENJAMIN    To Whom It May Concern:   For Medical reasons, please excuse the above named employee from work for the following dates:  Start:   02/15/2011  End:   02/18/2011 Without restrictions  If you need additional information, please feel free to contact our office.         Sincerely,    Milus Mallick. Lodema Hong, M.D.

## 2011-04-05 NOTE — Op Note (Signed)
Carrie Perez, Carrie Perez                        ACCOUNT NO.:  [id number]   MEDICAL RECORD NO.:  [id number]                   PATIENT TYPE:  OBV   LOCATION:  9138                                 FACILITY:  [facility]   PHYSICIAN:  Daniel L. Eda Paschal, M.D.           DATE OF BIRTH:  03-Mar-1961   DATE OF PROCEDURE:  05/09/2003  DATE OF DISCHARGE:                                 OPERATIVE REPORT   PREOPERATIVE DIAGNOSIS:  Previous tubal ligation with desire for future  pregnancy.   POSTOPERATIVE DIAGNOSIS:  Previous tubal ligation with desire for future  pregnancy.   PROCEDURE:  Bilateral microsurgical anastomosis followed by left  salpingectomy.   SURGEON:  Daniel L. Eda Paschal, M.D.   FIRST ASSISTANT:  Ivor Costa. Farrel Gobble, M.D.   ANESTHESIA:  General endotracheal.   FINDINGS:  At the time of laparotomy the patient had some whitish-type  changes on the top of the uterus that were consistent with some  endometriosis.  There was also the same type of whitish, almost exudate, on  the small and large bowel.  The patient had really been asymptomatic but all  these had the appearance of endometriosis.  Both ovaries were normal.  The  uterus itself was normal except as described above.  Right fallopian tube  showed evidence of a previous tubal ligation with a good isthmic portion  present proximally and a long distal portion present with normal fimbria;  however, it still required an isthmic ampullary anastomosis on the right.  Once this had been done, the patient had a 7 cm right fallopian tube.  On  the left the patient showed evidence of tubal ligation as well.  There was a  good proximal isthmic portion but there was a very short ampullary portion  and when anastomosis was done, it was an isthmic, almost distal ampullary  anastomosis with less than 4 cm of fallopian tube left on the left.  Fimbria  were normal on the left.   PROCEDURE:  After adequate general endotracheal anesthesia, the  patient was  placed in the supine position, prepped and draped in usual sterile manner.  A Foley catheter was inserted into the bladder, a pediatric __________ was  inserted into the uterus, the vagina was packed to elevate the uterus.  The  patient's previous Pfannenstiel incision that had been used for cesarean  sections was utilized, it was opened.  The fascia was opened transversely.  The peritoneum was entered vertically.  Subcutaneous bleeders were clamped  and Bovied as encountered.  When the peritoneal cavity was opened the above  findings were noted.  Bowel as well as retrouterine area was packed in order  to elevate the pelvic structures to make microsurgical anastomosis easier.  Both tubes were prepped with an iris scissors and excellent proximal and  distal segments were identified.  There was some bleeding which was  controlled with bipolar coagulation.  The excessive tube that had been  obstructed was removed with the [facility] microelectrode.  Several adhesions  were also removed with the [facility] microelectrode.  Both tubes were prepped  in a similar fashion.  The microscope was then put in place.  First the left  side, which was clearly was an inferior side because of the short length of  the distal segment, was done.  There was significant distortion between the  proximal and distal segment.  A 6-0 Vicryl was utilized rather than 8-0  because of this.  Four sutures were placed at 3, 6, 9, and 12 o'clock and  the muscularis was then approximated successfully.  The serosa was also  approximated with interrupted 6-0 Vicryl.  Prior to doing this, the defect  in the mesosalpinx had been closed with 6-0 Vicryl which also brought the  two segments of tube close together.  After the left side had been done  attention was turned to the right side.  The right side was started by  bringing the mesosalpinx together with 6-0 Vicryl.  A 6-0 Vicryl was used in  the muscularis.  A much more  attractive anastomosis was done because of less  discrepancy between the proximal and distal segments.  Sutures were placed  at 3, 6, 9, and 12 o'clock in the muscularis of the tube and the tube was  approximated nicely.  The serosa was then brought together with interrupted  6-0 Vicryl.  Both tubes at this point then were patent with methylene blue  which had been utilized throughout the procedure to identify patent tubes.  Because the right side looked very good and because of concern the left side  would not function well and that she would be at a higher risk for an  ectopic on the left with no higher chance of intrauterine pregnancy with a  good right side, it was felt that it was in the patient's best interest to  avoid ectopic to remove the left side and so at this point of the procedure,  the mesosalpinx was clamped, cut, sutured with 0 Vicryl, and the left tube  was removed and sent to pathology.  Copious irrigation was done with Ringers  lactate.  All cul-de-sac fluid was removed.  There was no bleeding noted.  All the packs were removed.  The peritoneum was closed with a running 0  Vicryl.  The sub and suprafascial spaces were copiously irrigated.  The  fascia was then closed with two running 0 Vicryl.  The skin was closed with  staples.  Estimated blood loss for the entire procedure was 100 mL with none  replaced.  The patient tolerated the procedure well and left the operating  room in satisfactory condition draining clear urine from her Foley catheter.                                                Daniel L. Eda Paschal, M.D.    Carrie Perez  D:  05/10/2003  T:  05/10/2003  Job:  [id number]

## 2011-04-05 NOTE — Discharge Summary (Signed)
NAMEAASHA, Carrie Perez                        ACCOUNT NO.:  [id number]   MEDICAL RECORD NO.:  [id number]                   PATIENT TYPE:  OBV   LOCATION:  9138                                 FACILITY:  [facility]   PHYSICIAN:  Daniel L. Eda Paschal, M.D.           DATE OF BIRTH:  05/09/1961   DATE OF ADMISSION:  05/09/2003  DATE OF DISCHARGE:  05/10/2003                                 DISCHARGE SUMMARY   DISCHARGE DIAGNOSIS:  Preoperative previous tubal ligation with desire for  future pregnancy.  Postoperatively, previous tubal ligation with desire for  future pregnancy with endometriosis on the time of surgery.  Status post  bilateral microsurgical anastomosis followed by left salpingotomy.   HISTORY:  A 50 year old female with a previous tubal ligation who desired  future pregnancy.  HSG shown good proximal segment plus semen analysis was  within normal limits.  The patient therefore was admitted.   HOSPITAL COURSE:  On 05/09/03, the patient underwent a bilateral  microsurgical anastomosis followed by left salpingotomy by Dr. Edyth Gunnels.  It was found at the time of laparotomy that the patient had some  whitish-type changes in the top of the uterus that were consistent with  endometriosis.  We saw those same type whitish almost exudate on the small  and large bowel.  The patient had really been asymptomatic but all these had  the appearance of endometriosis.  Both ovaries were normal.  The right  fallopian tube showed evidence of previous tubal ligation with good isthmic  portion present proximally and a long distal portion present with normal  fimbria however, it still required an isthmic ampullary anastomosis on the  right.  Once this had been done, the patient had a 7 cm right fallopian  tube.  On the left, the patient showed evidence of tubal ligation as well.  There was a good proximal isthmic portion but there was a very short  ampullary portion and when the anastomosis  was done, it was an isthmic  almost distal ampullary anastomosis with less than 4 cm of fallopian tube  left on the left.  Postoperatively, the patient remained afebrile, alert and  stable condition.  Her Foley was discontinued on the morning of 05/10/03.  She has a soft diet.  She was thought to be stable by 1315.  On 05/10/03, the  patient was discharged to home.   LABORATORY DATA:  Pathology revealed endometrial-type glands and somewhat  consistent with endometriosis.   DISPOSITION:  The patient is discharged to home.  She is to follow up in the  office on 05/12/03 for staple removal.  If she has any problems prior to that  time, to be seen in the office.      Susa Loffler, P.A.                    Daniel L. Eda Paschal, M.D.    TSG/MEDQ  D:  07/15/2003  T:  07/16/2003  Job:  [id number]

## 2011-04-23 ENCOUNTER — Telehealth: Payer: Self-pay | Admitting: Family Medicine

## 2011-04-23 MED ORDER — CETIRIZINE HCL 10 MG PO TABS: ORAL_TABLET | ORAL | Status: DC

## 2011-04-23 NOTE — Telephone Encounter (Signed)
Sent in as requested 

## 2011-08-07 ENCOUNTER — Encounter: Payer: Self-pay | Admitting: Family Medicine

## 2011-08-08 ENCOUNTER — Encounter: Payer: BC Managed Care – PPO | Admitting: Family Medicine

## 2011-08-08 ENCOUNTER — Encounter: Payer: Self-pay | Admitting: Family Medicine

## 2011-08-15 ENCOUNTER — Telehealth: Payer: Self-pay

## 2011-08-15 ENCOUNTER — Ambulatory Visit (INDEPENDENT_AMBULATORY_CARE_PROVIDER_SITE_OTHER): Payer: Self-pay

## 2011-08-15 VITALS — BP 112/78 | Wt 109.4 lb

## 2011-08-15 DIAGNOSIS — J309 Allergic rhinitis, unspecified: Secondary | ICD-10-CM

## 2011-08-15 MED ORDER — METHYLPREDNISOLONE ACETATE 80 MG/ML IJ SUSP
80.0000 mg | Freq: Once | INTRAMUSCULAR | Status: AC
  Administered 2011-08-15: 80 mg via INTRAMUSCULAR

## 2011-08-15 NOTE — Telephone Encounter (Signed)
pls administer depomedrol 80mg  IM for allergies

## 2011-08-15 NOTE — Telephone Encounter (Signed)
Will administer

## 2011-08-15 NOTE — Progress Notes (Signed)
Shot given with no complications

## 2011-08-27 LAB — CBC
Hemoglobin: 9.7 — ABNORMAL LOW
MCHC: 32.1
RBC: 4.06
WBC: 7

## 2011-08-27 LAB — PREGNANCY, URINE

## 2011-12-20 ENCOUNTER — Encounter (HOSPITAL_COMMUNITY): Payer: Self-pay | Admitting: Emergency Medicine

## 2011-12-20 ENCOUNTER — Emergency Department (HOSPITAL_COMMUNITY): Payer: Self-pay

## 2011-12-20 ENCOUNTER — Other Ambulatory Visit: Payer: Self-pay

## 2011-12-20 ENCOUNTER — Emergency Department (HOSPITAL_COMMUNITY)
Admission: EM | Admit: 2011-12-20 | Discharge: 2011-12-20 | Disposition: A | Discharge status: Home / Self Care | Payer: Self-pay | Attending: Emergency Medicine | Admitting: Emergency Medicine

## 2011-12-20 DIAGNOSIS — Z87828 Personal history of other (healed) physical injury and trauma: Secondary | ICD-10-CM | POA: Insufficient documentation

## 2011-12-20 DIAGNOSIS — M549 Dorsalgia, unspecified: Secondary | ICD-10-CM | POA: Insufficient documentation

## 2011-12-20 DIAGNOSIS — R0602 Shortness of breath: Secondary | ICD-10-CM | POA: Insufficient documentation

## 2011-12-20 DIAGNOSIS — R079 Chest pain, unspecified: Secondary | ICD-10-CM | POA: Insufficient documentation

## 2011-12-20 DIAGNOSIS — D509 Iron deficiency anemia, unspecified: Secondary | ICD-10-CM | POA: Insufficient documentation

## 2011-12-20 DIAGNOSIS — R064 Hyperventilation: Secondary | ICD-10-CM | POA: Insufficient documentation

## 2011-12-20 DIAGNOSIS — E785 Hyperlipidemia, unspecified: Secondary | ICD-10-CM | POA: Insufficient documentation

## 2011-12-20 DIAGNOSIS — F17200 Nicotine dependence, unspecified, uncomplicated: Secondary | ICD-10-CM | POA: Insufficient documentation

## 2011-12-20 LAB — CBC
MCH: 30.4 pg (ref 26.0–34.0)
MCHC: 34.3 g/dL (ref 30.0–36.0)
MCV: 88.6 fL (ref 78.0–100.0)
Platelets: 258 10*3/uL (ref 150–400)
RDW: 13.2 % (ref 11.5–15.5)

## 2011-12-20 LAB — BASIC METABOLIC PANEL
CO2: 26 mEq/L (ref 19–32)
Calcium: 10.5 mg/dL (ref 8.4–10.5)
Creatinine, Ser: 0.7 mg/dL (ref 0.50–1.10)
GFR calc Af Amer: >90 mL/min (ref >90)
GFR calc non Af Amer: >90 mL/min (ref >90)

## 2011-12-20 LAB — CARDIAC PANEL(CRET KIN+CKTOT+MB+TROPI): Relative Index: 1.2 (ref 0.0–2.5)

## 2011-12-20 LAB — D-DIMER, QUANTITATIVE: D-Dimer, Quant: 0.59 ug/mL-FEU — ABNORMAL HIGH (ref 0.00–0.48)

## 2011-12-20 MED ORDER — IOHEXOL 350 MG/ML SOLN
100.0000 mL | Freq: Once | INTRAVENOUS | Status: AC | PRN
  Administered 2011-12-20: 100 mL via INTRAVENOUS

## 2011-12-20 MED ORDER — POTASSIUM CHLORIDE CRYS ER 20 MEQ PO TBCR
20.0000 meq | EXTENDED_RELEASE_TABLET | Freq: Once | ORAL | Status: AC
  Administered 2011-12-20: 20 meq via ORAL
  Filled 2011-12-20: qty 1

## 2011-12-20 MED ORDER — LORAZEPAM 2 MG/ML IJ SOLN
1.0000 mg | Freq: Once | INTRAMUSCULAR | Status: AC
  Administered 2011-12-20: 1 mg via INTRAVENOUS
  Filled 2011-12-20: qty 1

## 2011-12-20 NOTE — ED Notes (Signed)
Patient with no complaints at this time. Respirations even and unlabored. Skin warm/dry. Discharge instructions reviewed with patient at this time. Patient given opportunity to voice concerns/ask questions. IV removed per policy and band-aid applied to site. Patient discharged at this time and left Emergency Department with steady gait.  

## 2011-12-20 NOTE — ED Notes (Signed)
Patient transported to CT 

## 2011-12-20 NOTE — ED Notes (Signed)
Patient ambulatory to restroom with steady gait.

## 2011-12-20 NOTE — ED Notes (Signed)
Pt was at work when began to feel weak and "passed out" per pt. C/o sob. Pt hyperventilating upon arrival. C/o pain to left upper back worse with inspiration. Skin warm/dry. Pt tearful

## 2011-12-20 NOTE — ED Notes (Signed)
Patient sitting in bed at present. NAD noted. Patient respirations even and unlabored. On continuous cardiac monitoring. Will continue to monitor.
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
## 2011-12-23 ENCOUNTER — Ambulatory Visit (INDEPENDENT_AMBULATORY_CARE_PROVIDER_SITE_OTHER): Payer: Self-pay | Admitting: Family Medicine

## 2011-12-23 ENCOUNTER — Encounter: Payer: Self-pay | Admitting: Family Medicine

## 2011-12-23 VITALS — BP 110/70 | HR 85 | Resp 16 | Ht 64.0 in | Wt 107.0 lb

## 2011-12-23 DIAGNOSIS — F17200 Nicotine dependence, unspecified, uncomplicated: Secondary | ICD-10-CM

## 2011-12-23 DIAGNOSIS — L659 Nonscarring hair loss, unspecified: Secondary | ICD-10-CM

## 2011-12-23 DIAGNOSIS — G4700 Insomnia, unspecified: Secondary | ICD-10-CM | POA: Insufficient documentation

## 2011-12-23 DIAGNOSIS — F32A Depression, unspecified: Secondary | ICD-10-CM

## 2011-12-23 DIAGNOSIS — F419 Anxiety disorder, unspecified: Secondary | ICD-10-CM | POA: Insufficient documentation

## 2011-12-23 DIAGNOSIS — F411 Generalized anxiety disorder: Secondary | ICD-10-CM

## 2011-12-23 DIAGNOSIS — F341 Dysthymic disorder: Secondary | ICD-10-CM

## 2011-12-23 DIAGNOSIS — F329 Major depressive disorder, single episode, unspecified: Secondary | ICD-10-CM

## 2011-12-23 HISTORY — DX: Depression, unspecified: F32.A

## 2011-12-23 HISTORY — DX: Anxiety disorder, unspecified: F41.9

## 2011-12-23 LAB — POCT URINALYSIS DIPSTICK
Bilirubin, UA: NEGATIVE
Ketones, UA: NEGATIVE
Leukocytes, UA: NEGATIVE
Nitrite, UA: NEGATIVE
pH, UA: 7

## 2011-12-23 MED ORDER — PAROXETINE HCL 10 MG PO TABS: 10.0000 mg | ORAL_TABLET | Freq: Every day | ORAL | Status: DC

## 2011-12-23 NOTE — Assessment & Plan Note (Signed)
Deteriorated with increased stress

## 2011-12-23 NOTE — Patient Instructions (Addendum)
F/u in 4 month.  New medication for anxiety and depression.  Pls set a standard sleep time , practice good sleep hygiene, and you may take benadryl at bedtime.  Blood sugar and urine are negative for diabetes.  Pls set a quit date to stop smoking

## 2011-12-23 NOTE — Assessment & Plan Note (Signed)
Down to 2 per day wants to quit, no substitute needed at this number, needs to set a quit date

## 2011-12-23 NOTE — Assessment & Plan Note (Signed)
Sleep hygiene discussed and stressed, pt to take benadryl

## 2011-12-26 ENCOUNTER — Telehealth: Payer: Self-pay

## 2011-12-26 DIAGNOSIS — F411 Generalized anxiety disorder: Secondary | ICD-10-CM

## 2011-12-26 MED ORDER — ALPRAZOLAM 0.25 MG PO TABS: ORAL_TABLET | ORAL | Status: DC

## 2011-12-26 NOTE — Telephone Encounter (Signed)
Was prescribed paxil. Took it 3 days and it has made her very nervous and anxious and jittery all over. Heart not racing but she is shaky all over. States xanax has worked well for her in the past. Will be at home waiting for return call. [phone]

## 2011-12-26 NOTE — Telephone Encounter (Signed)
pls advise her to break the paxil in half( if possible) and try to take half daily. Xanax lowest dose is prescribed once daily as needed, pls send in

## 2011-12-26 NOTE — Telephone Encounter (Signed)
Patient aware and will do as advised

## 2011-12-26 NOTE — Progress Notes (Signed)
  Subjective:    Patient ID: Carrie Perez, female    DOB: [birth year], 51 y.o.   MRN: [id number]  HPI Pt in following recent ed visit for increased anxiety and generalized fatigue. She is unemployed and very stressed about this. She feels overwhelmed, is having crying spells, she has significant hair loss again.she has difficulty both falling and staying asleep She has used xanax in the past as well as an antidepressant   Review of Systems See HPI Denies recent fever or chills. Denies sinus pressure, nasal congestion, ear pain or sore throat. Denies chest congestion, productive cough or wheezing. Denies chest pains, palpitations and leg swelling Denies abdominal pain, nausea, vomiting,diarrhea or constipation.   Denies dysuria, frequency, hesitancy or incontinence. Denies joint pain, swelling and limitation in mobility. Denies headaches  Denies skin break down or rash.        Objective:   Physical Exam Patient alert and oriented and in no cardiopulmonary distress.Extremely anxious  HEENT: No facial asymmetry, EOMI, no sinus tenderness,  oropharynx pink and moist.  Neck supple no adenopathy.  Chest: Clear to auscultation bilaterally.Decreased air entry   CVS: S1, S2 no murmurs, no S3.  ABD: Soft non tender. Bowel sounds normal.  Ext: No edema  MS: Adequate ROM spine, shoulders, hips and knees.  Skin: Intact, no ulcerations or rash noted.  Psych: Good eye contact,  Memory intact , both  anxious and  depressed appearing.  CNS: CN 2-12 intact, power, tone and sensation normal throughout.        Assessment & Plan:

## 2011-12-26 NOTE — Assessment & Plan Note (Signed)
Deteriorated, requests xanax specifically which has helped in the past

## 2012-02-13 ENCOUNTER — Telehealth: Payer: Self-pay | Admitting: Family Medicine

## 2012-02-13 ENCOUNTER — Other Ambulatory Visit: Payer: Self-pay | Admitting: Family Medicine

## 2012-02-13 MED ORDER — PREDNISONE (PAK) 5 MG PO TABS: 5.0000 mg | ORAL_TABLET | ORAL | Status: DC

## 2012-02-13 NOTE — Telephone Encounter (Signed)
Advise IO will; send prednisone dose pack

## 2012-02-13 NOTE — Telephone Encounter (Signed)
Has allergies very bad and side of face was swollen. Already took allergy med this am . Wanted to know if she could take benadryl. I advised yes but she wants to know if you will send her in something to the pharmacy before 5 so her husband can go pick it up for her. Wants me to call back as soon as its sent   [facility]

## 2012-02-13 NOTE — Telephone Encounter (Signed)
Patient aware.

## 2012-03-24 ENCOUNTER — Telehealth: Payer: Self-pay | Admitting: Family Medicine

## 2012-03-24 DIAGNOSIS — F411 Generalized anxiety disorder: Secondary | ICD-10-CM

## 2012-03-25 MED ORDER — ALPRAZOLAM 0.25 MG PO TABS: ORAL_TABLET | ORAL | Status: DC

## 2012-03-25 NOTE — Telephone Encounter (Signed)
Sent in

## 2012-04-21 ENCOUNTER — Ambulatory Visit: Payer: Self-pay | Admitting: Family Medicine

## 2012-06-22 ENCOUNTER — Other Ambulatory Visit: Payer: Self-pay | Admitting: Family Medicine

## 2012-08-04 ENCOUNTER — Other Ambulatory Visit: Payer: Self-pay | Admitting: Family Medicine

## 2012-08-04 DIAGNOSIS — Z139 Encounter for screening, unspecified: Secondary | ICD-10-CM

## 2012-08-11 ENCOUNTER — Telehealth: Payer: Self-pay | Admitting: Family Medicine

## 2012-08-11 ENCOUNTER — Ambulatory Visit (HOSPITAL_COMMUNITY)
Admission: RE | Admit: 2012-08-11 | Discharge: 2012-08-11 | Disposition: A | Discharge status: Home / Self Care | Payer: BC Managed Care – PPO | Source: Ambulatory Visit | Attending: Family Medicine | Admitting: Family Medicine

## 2012-08-11 ENCOUNTER — Other Ambulatory Visit: Payer: Self-pay

## 2012-08-11 ENCOUNTER — Other Ambulatory Visit: Payer: Self-pay | Admitting: Family Medicine

## 2012-08-11 DIAGNOSIS — Z139 Encounter for screening, unspecified: Secondary | ICD-10-CM

## 2012-08-11 DIAGNOSIS — Z1231 Encounter for screening mammogram for malignant neoplasm of breast: Secondary | ICD-10-CM | POA: Insufficient documentation

## 2012-08-11 MED ORDER — IBUPROFEN 800 MG PO TABS: ORAL_TABLET | ORAL | Status: DC

## 2012-08-11 NOTE — Telephone Encounter (Signed)
States that she has back pain sometimes after moving heavy boxes.

## 2012-08-11 NOTE — Telephone Encounter (Signed)
Med sent.

## 2012-08-11 NOTE — Telephone Encounter (Signed)
Entered historically, please send in and let her know

## 2012-08-13 ENCOUNTER — Other Ambulatory Visit: Payer: Self-pay | Admitting: Family Medicine

## 2012-08-13 DIAGNOSIS — N63 Unspecified lump in unspecified breast: Secondary | ICD-10-CM

## 2012-08-17 ENCOUNTER — Other Ambulatory Visit: Payer: BC Managed Care – PPO

## 2012-08-19 ENCOUNTER — Ambulatory Visit (HOSPITAL_COMMUNITY)
Admission: RE | Admit: 2012-08-19 | Discharge: 2012-08-19 | Disposition: A | Discharge status: Home / Self Care | Payer: BC Managed Care – PPO | Source: Ambulatory Visit | Attending: Family Medicine | Admitting: Family Medicine

## 2012-08-19 DIAGNOSIS — R928 Other abnormal and inconclusive findings on diagnostic imaging of breast: Secondary | ICD-10-CM | POA: Insufficient documentation

## 2012-08-19 DIAGNOSIS — N63 Unspecified lump in unspecified breast: Secondary | ICD-10-CM

## 2012-11-06 ENCOUNTER — Other Ambulatory Visit: Payer: Self-pay

## 2012-11-06 ENCOUNTER — Telehealth: Payer: Self-pay | Admitting: Family Medicine

## 2012-11-06 ENCOUNTER — Other Ambulatory Visit: Payer: Self-pay | Admitting: Family Medicine

## 2012-11-06 DIAGNOSIS — F411 Generalized anxiety disorder: Secondary | ICD-10-CM

## 2012-11-06 MED ORDER — ALPRAZOLAM 0.25 MG PO TABS: ORAL_TABLET | ORAL | Status: DC

## 2012-11-06 MED ORDER — CETIRIZINE HCL 10 MG PO TABS: ORAL_TABLET | ORAL | Status: DC

## 2012-11-06 MED ORDER — IBUPROFEN 800 MG PO TABS: ORAL_TABLET | ORAL | Status: DC

## 2012-11-06 NOTE — Telephone Encounter (Signed)
meds sent

## 2013-03-23 ENCOUNTER — Encounter: Payer: Self-pay | Admitting: Family Medicine

## 2013-03-23 ENCOUNTER — Ambulatory Visit (INDEPENDENT_AMBULATORY_CARE_PROVIDER_SITE_OTHER): Payer: Self-pay | Admitting: Family Medicine

## 2013-03-23 VITALS — BP 130/80 | HR 67 | Resp 16 | Ht 64.0 in | Wt 106.8 lb

## 2013-03-23 DIAGNOSIS — F17200 Nicotine dependence, unspecified, uncomplicated: Secondary | ICD-10-CM

## 2013-03-23 DIAGNOSIS — F411 Generalized anxiety disorder: Secondary | ICD-10-CM

## 2013-03-23 DIAGNOSIS — R232 Flushing: Secondary | ICD-10-CM | POA: Insufficient documentation

## 2013-03-23 DIAGNOSIS — R5381 Other malaise: Secondary | ICD-10-CM

## 2013-03-23 DIAGNOSIS — R630 Anorexia: Secondary | ICD-10-CM | POA: Insufficient documentation

## 2013-03-23 DIAGNOSIS — N951 Menopausal and female climacteric states: Secondary | ICD-10-CM

## 2013-03-23 DIAGNOSIS — R5383 Other fatigue: Secondary | ICD-10-CM

## 2013-03-23 DIAGNOSIS — E785 Hyperlipidemia, unspecified: Secondary | ICD-10-CM

## 2013-03-23 MED ORDER — MEGESTROL ACETATE 40 MG PO TABS: 40.0000 mg | ORAL_TABLET | Freq: Every day | ORAL | Status: AC

## 2013-03-23 MED ORDER — ALPRAZOLAM 0.25 MG PO TABS: ORAL_TABLET | ORAL | Status: DC

## 2013-03-23 MED ORDER — VENLAFAXINE HCL ER 37.5 MG PO CP24: 37.5000 mg | ORAL_CAPSULE | Freq: Every day | ORAL | Status: DC

## 2013-03-23 NOTE — Progress Notes (Signed)
  Subjective:    Patient ID: Carrie Perez, female    DOB: [birth year], 52 y.o.   MRN: [id number]  HPI Hot flashes and reduced  sex drive for the past 3 weeks. Appetite is reduced  , concerned about weight loss and requests appetite stimulant. Denies uncontrolled depression, still smoking unwilling to set a quit date now, but wants to stop smoking eventually   Review of Systems See HPI Denies recent fever or chills. Denies sinus pressure, nasal congestion, ear pain or sore throat. Denies chest congestion, productive cough or wheezing. Denies chest pains, palpitations and leg swelling Denies abdominal pain, nausea, vomiting,diarrhea or constipation.   Denies dysuria, frequency, hesitancy or incontinence. Denies joint pain, swelling and limitation in mobility. Denies headaches, seizures, numbness, or tingling.  Denies skin break down or rash.        Objective:   Physical Exam Patient alert and oriented and in no cardiopulmonary distress.  HEENT: No facial asymmetry, EOMI, no sinus tenderness,  oropharynx pink and moist.  Neck supple no adenopathy.  Chest: Clear to auscultation bilaterally.  CVS: S1, S2 no murmurs, no S3.  ABD: Soft non tender. Bowel sounds normal.  Ext: No edema  MS: Adequate ROM spine, shoulders, hips and knees.  Skin: Intact, no ulcerations or rash noted.  Psych: Good eye contact, normal affect. Memory intact mildly  anxious not  depressed appearing.  CNS: CN 2-12 intact, power, tone and sensation normal throughout.        Assessment & Plan:

## 2013-03-23 NOTE — Patient Instructions (Addendum)
CPE in September.  Fasting lipid, cmp, hepatic and TSH as soon as possible.  Call as soon as you have insurance for referral for screeening colonoscopy please  Medication sent in for hot flashes and appetite  You will get information to help with smoking cessation also there are free classes at [facility]. Please plan to attend, you need to quit smoking

## 2013-03-28 ENCOUNTER — Encounter (HOSPITAL_COMMUNITY): Payer: Self-pay | Admitting: *Deleted

## 2013-03-28 ENCOUNTER — Emergency Department (HOSPITAL_COMMUNITY)
Admission: EM | Admit: 2013-03-28 | Discharge: 2013-03-28 | Disposition: A | Discharge status: Home / Self Care | Payer: Self-pay | Attending: Emergency Medicine | Admitting: Emergency Medicine

## 2013-03-28 ENCOUNTER — Emergency Department (HOSPITAL_COMMUNITY): Payer: Self-pay

## 2013-03-28 DIAGNOSIS — Z8781 Personal history of (healed) traumatic fracture: Secondary | ICD-10-CM | POA: Insufficient documentation

## 2013-03-28 DIAGNOSIS — Z79899 Other long term (current) drug therapy: Secondary | ICD-10-CM | POA: Insufficient documentation

## 2013-03-28 DIAGNOSIS — F17200 Nicotine dependence, unspecified, uncomplicated: Secondary | ICD-10-CM | POA: Insufficient documentation

## 2013-03-28 DIAGNOSIS — E785 Hyperlipidemia, unspecified: Secondary | ICD-10-CM | POA: Insufficient documentation

## 2013-03-28 DIAGNOSIS — Z87828 Personal history of other (healed) physical injury and trauma: Secondary | ICD-10-CM | POA: Insufficient documentation

## 2013-03-28 DIAGNOSIS — R0789 Other chest pain: Secondary | ICD-10-CM

## 2013-03-28 DIAGNOSIS — D509 Iron deficiency anemia, unspecified: Secondary | ICD-10-CM | POA: Insufficient documentation

## 2013-03-28 DIAGNOSIS — R071 Chest pain on breathing: Secondary | ICD-10-CM | POA: Insufficient documentation

## 2013-03-28 LAB — CBC WITH DIFFERENTIAL/PLATELET
Eosinophils Relative: 0 % (ref 0–5)
HCT: 34.1 % — ABNORMAL LOW (ref 36.0–46.0)
Lymphocytes Relative: 29 % (ref 12–46)
Lymphs Abs: 1.2 10*3/uL (ref 0.7–4.0)
MCV: 86.5 fL (ref 78.0–100.0)
Neutro Abs: 2.7 10*3/uL (ref 1.7–7.7)
Platelets: 218 10*3/uL (ref 150–400)
RBC: 3.94 MIL/uL (ref 3.87–5.11)
WBC: 4.1 10*3/uL (ref 4.0–10.5)

## 2013-03-28 LAB — COMPREHENSIVE METABOLIC PANEL
ALT: 7 U/L (ref 0–35)
Alkaline Phosphatase: 46 U/L (ref 39–117)
CO2: 26 mEq/L (ref 19–32)
Calcium: 9.8 mg/dL (ref 8.4–10.5)
Chloride: 102 mEq/L (ref 96–112)
GFR calc Af Amer: >90 mL/min (ref >90)
GFR calc non Af Amer: 85 mL/min — ABNORMAL LOW (ref >90)
Glucose, Bld: 79 mg/dL (ref 70–99)
Sodium: 139 mEq/L (ref 135–145)
Total Bilirubin: 0.6 mg/dL (ref 0.3–1.2)

## 2013-03-28 MED ORDER — ONDANSETRON 4 MG PO TBDP
4.0000 mg | ORAL_TABLET | Freq: Once | ORAL | Status: AC
  Administered 2013-03-28: 4 mg via ORAL
  Filled 2013-03-28: qty 1

## 2013-03-28 MED ORDER — NAPROXEN 500 MG PO TABS: 500.0000 mg | ORAL_TABLET | Freq: Two times a day (BID) | ORAL | Status: DC

## 2013-03-28 NOTE — ED Notes (Signed)
nad noted prior to dc. Dc instructions reviewed along with f.u care. Voiced understanding and 1 script was given to pt.

## 2013-03-28 NOTE — ED Notes (Signed)
Pt called me in room because she states she is vomiting; light brown emesis seen in trash can; pt had just eat peanut butter and crackers; pt given basin and encouraged to use it to vomit in; Dr. Estell Harpin informed

## 2013-03-28 NOTE — ED Notes (Signed)
Pt c/o burning sensation to chest area, felt like she was going to pass out, had one episode last night and another one this am, states that she has been having problems with feeling like this but has not had any episodes back to back like last night and today. Episodes are associated with nausea, sob.

## 2013-03-28 NOTE — ED Notes (Signed)
Dr. Estell Harpin in to see pt at this time.
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
## 2013-04-04 NOTE — Assessment & Plan Note (Signed)
Appetite stimulant prescribed to help with weight gain, this has been beneficial in the past

## 2013-04-04 NOTE — Assessment & Plan Note (Signed)
Pt also experiences anxiety and depression, venlafaxine prescried

## 2013-04-04 NOTE — Assessment & Plan Note (Signed)
Unchanged. Patient counseled for approximately 5 minutes regarding the health risks of ongoing nicotine use, specifically all types of cancer, heart disease, stroke and respiratory failure. The options available for help with cessation ,the behavioral changes to assist the process, and the option to either gradully reduce usage  Or abruptly stop.is also discussed. Pt is also encouraged to set specific goals in number of cigarettes used daily, as well as to set a quit date.  

## 2013-05-15 ENCOUNTER — Other Ambulatory Visit: Payer: Self-pay | Admitting: Family Medicine

## 2013-08-18 ENCOUNTER — Other Ambulatory Visit: Payer: Self-pay

## 2013-08-18 ENCOUNTER — Telehealth: Payer: Self-pay | Admitting: Family Medicine

## 2013-08-18 MED ORDER — NAPROXEN 500 MG PO TABS: 500.0000 mg | ORAL_TABLET | Freq: Two times a day (BID) | ORAL | Status: DC

## 2013-08-18 MED ORDER — ALPRAZOLAM 0.25 MG PO TABS: ORAL_TABLET | ORAL | Status: DC

## 2013-08-18 NOTE — Telephone Encounter (Signed)
Patient was to followup for a physical in September.  Requested meds refilled for 30 days only and reminder letter mailed.

## 2013-09-20 ENCOUNTER — Telehealth: Payer: Self-pay

## 2013-09-21 ENCOUNTER — Other Ambulatory Visit: Payer: Self-pay

## 2013-09-21 ENCOUNTER — Other Ambulatory Visit: Payer: Self-pay | Admitting: Family Medicine

## 2013-09-21 LAB — HEPATIC FUNCTION PANEL
Alkaline Phosphatase: 48 U/L (ref 39–117)
Bilirubin, Direct: 0.2 mg/dL (ref 0.0–0.3)
Indirect Bilirubin: 0.8 mg/dL (ref 0.0–0.9)
Total Bilirubin: 1 mg/dL (ref 0.3–1.2)

## 2013-09-21 LAB — LIPID PANEL
LDL Cholesterol: 131 mg/dL — ABNORMAL HIGH (ref 0–99)
Triglycerides: 68 mg/dL (ref <150)

## 2013-09-21 MED ORDER — ALPRAZOLAM 0.25 MG PO TABS: ORAL_TABLET | ORAL | Status: DC

## 2013-09-21 MED ORDER — NAPROXEN 500 MG PO TABS: 500.0000 mg | ORAL_TABLET | Freq: Two times a day (BID) | ORAL | Status: DC

## 2013-09-21 MED ORDER — CETIRIZINE HCL 10 MG PO TABS: 10.0000 mg | ORAL_TABLET | Freq: Every day | ORAL | Status: DC

## 2013-09-21 NOTE — Telephone Encounter (Signed)
Patient stopped by office and is asking for refills of xanax, zyrtec, and naproxen.  Med refilled to last until patient comes in on 12/10

## 2013-10-27 ENCOUNTER — Ambulatory Visit (INDEPENDENT_AMBULATORY_CARE_PROVIDER_SITE_OTHER): Payer: BC Managed Care – PPO | Admitting: Family Medicine

## 2013-10-27 ENCOUNTER — Encounter: Payer: Self-pay | Admitting: Family Medicine

## 2013-10-27 ENCOUNTER — Other Ambulatory Visit (HOSPITAL_COMMUNITY)
Admission: RE | Admit: 2013-10-27 | Discharge: 2013-10-27 | Disposition: A | Discharge status: Home / Self Care | Payer: BC Managed Care – PPO | Source: Ambulatory Visit | Attending: Family Medicine | Admitting: Family Medicine

## 2013-10-27 ENCOUNTER — Encounter (INDEPENDENT_AMBULATORY_CARE_PROVIDER_SITE_OTHER): Payer: Self-pay

## 2013-10-27 VITALS — BP 128/80 | HR 84 | Resp 18 | Ht 64.0 in | Wt 103.1 lb

## 2013-10-27 DIAGNOSIS — Z0000 Encounter for general adult medical examination without abnormal findings: Secondary | ICD-10-CM

## 2013-10-27 DIAGNOSIS — M549 Dorsalgia, unspecified: Secondary | ICD-10-CM | POA: Insufficient documentation

## 2013-10-27 DIAGNOSIS — F411 Generalized anxiety disorder: Secondary | ICD-10-CM

## 2013-10-27 DIAGNOSIS — Z1239 Encounter for other screening for malignant neoplasm of breast: Secondary | ICD-10-CM

## 2013-10-27 DIAGNOSIS — Z1151 Encounter for screening for human papillomavirus (HPV): Secondary | ICD-10-CM | POA: Insufficient documentation

## 2013-10-27 DIAGNOSIS — Z1211 Encounter for screening for malignant neoplasm of colon: Secondary | ICD-10-CM

## 2013-10-27 DIAGNOSIS — Z01419 Encounter for gynecological examination (general) (routine) without abnormal findings: Secondary | ICD-10-CM | POA: Insufficient documentation

## 2013-10-27 DIAGNOSIS — J309 Allergic rhinitis, unspecified: Secondary | ICD-10-CM

## 2013-10-27 DIAGNOSIS — F17200 Nicotine dependence, unspecified, uncomplicated: Secondary | ICD-10-CM

## 2013-10-27 LAB — POC HEMOCCULT BLD/STL (OFFICE/1-CARD/DIAGNOSTIC): Fecal Occult Blood, POC: NEGATIVE

## 2013-10-27 MED ORDER — IBUPROFEN 800 MG PO TABS: ORAL_TABLET | ORAL | Status: DC

## 2013-10-27 MED ORDER — METHYLPREDNISOLONE ACETATE 80 MG/ML IJ SUSP
80.0000 mg | Freq: Once | INTRAMUSCULAR | Status: AC
  Administered 2013-10-27: 80 mg via INTRAMUSCULAR

## 2013-10-27 MED ORDER — CETIRIZINE HCL 10 MG PO TBDP: 1.0000 | ORAL_TABLET | Freq: Every day | ORAL | Status: DC

## 2013-10-27 MED ORDER — ALPRAZOLAM 0.25 MG PO TABS: ORAL_TABLET | ORAL | Status: DC

## 2013-10-27 MED ORDER — PREDNISONE 5 MG PO TABS: 5.0000 mg | ORAL_TABLET | Freq: Two times a day (BID) | ORAL | Status: AC

## 2013-10-27 NOTE — Progress Notes (Signed)
   Subjective:    Patient ID: Carrie Perez, female    DOB: June 18, 1961, 52 y.o.   MRN: [id number]  HPI Pt in for annual exam. No specific concerns voiced. Still smoking and unwilling to commit to quitting at this time Cancer screening needs are behind and are addressed with appropriate referrals    Review of Systems See HPI Denies recent fever or chills. Denies sinus pressure, nasal congestion, ear pain or sore throat. Denies chest congestion, productive cough or wheezing. Denies chest pains, palpitations and leg swelling Denies abdominal pain, nausea, vomiting,diarrhea or constipation.   Denies dysuria, frequency, hesitancy or incontinence. Denies joint pain, swelling and limitation in mobility. Denies headaches, seizures, numbness, or tingling. Denies depression, uncontrolled anxiety or insomnia. Denies skin break down or rash.        Objective:   Physical Exam  Pleasant well nourished female, alert and oriented x 3, in no cardio-pulmonary distress. Afebrile. HEENT No facial trauma or asymetry. Sinuses non tender.  EOMI, PERTL, fundoscopic exam is normal, no hemorhage or exudate.  External ears normal, tympanic membranes clear. Oropharynx moist, no exudate, fair  dentition. Neck: supple, no adenopathy,JVD or thyromegaly.No bruits.  Chest: Clear to ascultation bilaterally.No crackles or wheezes. Non tender to palpation  Breast: No asymetry,no masses. No nipple discharge or inversion. No axillary or supraclavicular adenopathy  Cardiovascular system; Heart sounds normal,  S1 and  S2 ,no S3.  No murmur, or thrill. Apical beat not displaced Peripheral pulses normal.  Abdomen: Soft, non tender, no organomegaly or masses. No bruits. Bowel sounds normal. No guarding, tenderness or rebound.  Rectal:  No mass. Guaiac negative stool.  GU: External genitalia normal. No lesions. Vaginal canal normal.No discharge. Uterus normal size, no adnexal masses, no  cervical motion or adnexal tenderness.  Musculoskeletal exam: Full ROM of spine, hips , shoulders and knees. No deformity ,swelling or crepitus noted. No muscle wasting or atrophy.   Neurologic: Cranial nerves 2 to 12 intact. Power, tone ,sensation and reflexes normal throughout. No disturbance in gait. No tremor.  Skin: Intact, no ulceration, erythema , scaling or rash noted. Pigmentation normal throughout  Psych; Normal mood and affect. Judgement and concentration normal       Assessment & Plan:

## 2013-10-27 NOTE — Patient Instructions (Addendum)
F/u in 5 month, call if you need me before  Depo medrol injection for allergies, and prednisone sent in, also zyrtec  Ibuprofen 800mg  for back pain, or cramps, 30 tabs to last 4 month, stop naproxen  You are referred for mammogram as well as colonoscopy , both are due

## 2013-10-31 DIAGNOSIS — Z0000 Encounter for general adult medical examination without abnormal findings: Secondary | ICD-10-CM | POA: Insufficient documentation

## 2013-10-31 NOTE — Assessment & Plan Note (Signed)
Unchanged, continue xanax as before 

## 2013-10-31 NOTE — Assessment & Plan Note (Signed)
Unchanged. Patient counseled for approximately 5 minutes regarding the health risks of ongoing nicotine use, specifically all types of cancer, heart disease, stroke and respiratory failure. The options available for help with cessation ,the behavioral changes to assist the process, and the option to either gradully reduce usage  Or abruptly stop.is also discussed. Pt is also encouraged to set specific goals in number of cigarettes used daily, as well as to set a quit date.  

## 2013-10-31 NOTE — Assessment & Plan Note (Signed)
Annual exam as documented Nicotine use addressed and referrals made for cancer screening needed

## 2013-10-31 NOTE — Assessment & Plan Note (Signed)
Uncontrolled, depo medrol in office followed by short course of prednisone and pt to continue daily zyrtec

## 2013-11-01 ENCOUNTER — Encounter (INDEPENDENT_AMBULATORY_CARE_PROVIDER_SITE_OTHER): Payer: Self-pay | Admitting: *Deleted

## 2013-11-22 ENCOUNTER — Inpatient Hospital Stay (HOSPITAL_COMMUNITY): Admission: RE | Admit: 2013-11-22 | Payer: BC Managed Care – PPO | Source: Ambulatory Visit

## 2014-03-07 ENCOUNTER — Ambulatory Visit (HOSPITAL_COMMUNITY)
Admission: RE | Admit: 2014-03-07 | Discharge: 2014-03-07 | Disposition: A | Discharge status: Home / Self Care | Payer: BC Managed Care – PPO | Source: Ambulatory Visit | Attending: Family Medicine | Admitting: Family Medicine

## 2014-03-07 DIAGNOSIS — Z1239 Encounter for other screening for malignant neoplasm of breast: Secondary | ICD-10-CM

## 2014-03-07 DIAGNOSIS — Z1231 Encounter for screening mammogram for malignant neoplasm of breast: Secondary | ICD-10-CM | POA: Insufficient documentation

## 2014-03-11 ENCOUNTER — Telehealth: Payer: Self-pay

## 2014-03-11 MED ORDER — FLUTICASONE PROPIONATE 50 MCG/ACT NA SUSP: 2.0000 | Freq: Every day | NASAL | Status: DC

## 2014-03-11 NOTE — Addendum Note (Signed)
Addended by: Denman George B on: 03/11/2014 10:44 AM   Modules accepted: Orders

## 2014-03-11 NOTE — Telephone Encounter (Signed)
Spoke with patient and she is aware of advice.  She will try flonase and call office next week if allergy symptoms continue

## 2014-03-11 NOTE — Telephone Encounter (Signed)
Please advise if this is indicated.  

## 2014-03-11 NOTE — Telephone Encounter (Signed)
Called and left detailed message for patient to call back to office.

## 2014-03-11 NOTE — Telephone Encounter (Signed)
Zyrtec dose is 10mg  one daily, ok to send in flonase spray 2 puffs per nostril daily as additional med for uncontrrolled allergies that is my recommendation

## 2014-03-23 ENCOUNTER — Telehealth: Payer: Self-pay | Admitting: Family Medicine

## 2014-03-23 NOTE — Telephone Encounter (Signed)
Can do cbc and fasting lipid, could also wait till Nov for labs, let her know OK either way

## 2014-03-23 NOTE — Telephone Encounter (Signed)
Any lab work needed?

## 2014-03-23 NOTE — Telephone Encounter (Signed)
Patient wants to wait until Nov

## 2014-03-28 ENCOUNTER — Encounter (INDEPENDENT_AMBULATORY_CARE_PROVIDER_SITE_OTHER): Payer: Self-pay

## 2014-03-28 ENCOUNTER — Encounter: Payer: Self-pay | Admitting: Family Medicine

## 2014-03-28 ENCOUNTER — Ambulatory Visit (INDEPENDENT_AMBULATORY_CARE_PROVIDER_SITE_OTHER): Payer: BC Managed Care – PPO | Admitting: Family Medicine

## 2014-03-28 VITALS — BP 98/60 | HR 80 | Resp 18 | Ht 64.0 in | Wt 111.0 lb

## 2014-03-28 DIAGNOSIS — F411 Generalized anxiety disorder: Secondary | ICD-10-CM

## 2014-03-28 DIAGNOSIS — J309 Allergic rhinitis, unspecified: Secondary | ICD-10-CM

## 2014-03-28 DIAGNOSIS — G4700 Insomnia, unspecified: Secondary | ICD-10-CM

## 2014-03-28 DIAGNOSIS — E785 Hyperlipidemia, unspecified: Secondary | ICD-10-CM

## 2014-03-28 DIAGNOSIS — F17200 Nicotine dependence, unspecified, uncomplicated: Secondary | ICD-10-CM

## 2014-03-28 MED ORDER — MIRTAZAPINE 7.5 MG PO TABS: 7.5000 mg | ORAL_TABLET | Freq: Every day | ORAL | Status: DC

## 2014-03-28 NOTE — Patient Instructions (Addendum)
F/u in 3 months  Cut back on cigarettes you need to quit, and this will increase your appetite and help you to gain weight  New is remron for sleep will help nerves and help with weight gain.  3 to 4 pound weight gain per month is healthy, no STRESS on the heart   Need to get colonoscopy , pls call  Glad you feel generally well  Smoking Cessation, Tips for Success If you are ready to quit smoking, congratulations! You have chosen to help yourself be healthier. Cigarettes bring nicotine, tar, carbon monoxide, and other irritants into your body. Your lungs, heart, and blood vessels will be able to work better without these poisons. There are many different ways to quit smoking. Nicotine gum, nicotine patches, a nicotine inhaler, or nicotine nasal spray can help with physical craving. Hypnosis, support groups, and medicines help break the habit of smoking. WHAT THINGS CAN I DO TO MAKE QUITTING EASIER?  Here are some tips to help you quit for good:  Pick a date when you will quit smoking completely. Tell all of your friends and family about your plan to quit on that date.  Do not try to slowly cut down on the number of cigarettes you are smoking. Pick a quit date and quit smoking completely starting on that day.  Throw away all cigarettes.   Clean and remove all ashtrays from your home, work, and car.   On a card, write down your reasons for quitting. Carry the card with you and read it when you get the urge to smoke.   Cleanse your body of nicotine. Drink enough water and fluids to keep your urine clear or pale yellow. Do this after quitting to flush the nicotine from your body.   Learn to predict your moods. Do not let a bad situation be your excuse to have a cigarette. Some situations in your life might tempt you into wanting a cigarette.   Never have "just one" cigarette. It leads to wanting another and another. Remind yourself of your decision to quit.   Change habits  associated with smoking. If you smoked while driving or when feeling stressed, try other activities to replace smoking. Stand up when drinking your coffee. Brush your teeth after eating. Sit in a different chair when you read the paper. Avoid alcohol while trying to quit, and try to drink fewer caffeinated beverages. Alcohol and caffeine may urge you to smoke.   Avoid foods and drinks that can trigger a desire to smoke, such as sugary or spicy foods and alcohol.   Ask people who smoke not to smoke around you.   Have something planned to do right after eating or having a cup of coffee. For example, plan to take a walk or exercise.   Try a relaxation exercise to calm you down and decrease your stress. Remember, you may be tense and nervous for the first 2 weeks after you quit, but this will pass.   Find new activities to keep your hands busy. Play with a pen, coin, or rubber band. Doodle or draw things on paper.   Brush your teeth right after eating. This will help cut down on the craving for the taste of tobacco after meals. You can also try mouthwash.   Use oral substitutes in place of cigarettes. Try using lemon drops, carrots, cinnamon sticks, or chewing gum. Keep them handy so they are available when you have the urge to smoke.   When you have  the urge to smoke, try deep breathing.   Designate your home as a nonsmoking area.   If you are a heavy smoker, ask your health care provider about a prescription for nicotine chewing gum. It can ease your withdrawal from nicotine.   Reward yourself. Set aside the cigarette money you save and buy yourself something nice.   Look for support from others. Join a support group or smoking cessation program. Ask someone at home or at work to help you with your plan to quit smoking.   Always ask yourself, "Do I need this cigarette or is this just a reflex?" Tell yourself, "Today, I choose not to smoke," or "I do not want to smoke." You are  reminding yourself of your decision to quit.  Do not replace cigarette smoking with electronic cigarettes (commonly called e-cigarettes). The safety of e-cigarettes is unknown, and some may contain harmful chemicals.  If you relapse, do not give up! Plan ahead and think about what you will do the next time you get the urge to smoke.  HOW WILL I FEEL WHEN I QUIT SMOKING? You may have symptoms of withdrawal because your body is used to nicotine (the addictive substance in cigarettes). You may crave cigarettes, be irritable, feel very hungry, cough often, get headaches, or have difficulty concentrating. The withdrawal symptoms are only temporary. They are strongest when you first quit but will go away within 10 14 days. When withdrawal symptoms occur, stay in control. Think about your reasons for quitting. Remind yourself that these are signs that your body is healing and getting used to being without cigarettes. Remember that withdrawal symptoms are easier to treat than the major diseases that smoking can cause.  Even after the withdrawal is over, expect periodic urges to smoke. However, these cravings are generally short lived and will go away whether you smoke or not. Do not smoke!  WHAT RESOURCES ARE AVAILABLE TO HELP ME QUIT SMOKING? Your health care provider can direct you to community resources or hospitals for support, which may include:  Group support.  Education.  Hypnosis.  Therapy. Document Released: 08/02/2004 Document Revised: 08/25/2013 Document Reviewed: 04/22/2013 [facility] Patient Information 2014 [place], [place].

## 2014-05-12 NOTE — Assessment & Plan Note (Signed)
Controlled, no change in medication  

## 2014-05-12 NOTE — Assessment & Plan Note (Signed)
Unchanged. Patient counseled for approximately 5 minutes regarding the health risks of ongoing nicotine use, specifically all types of cancer, heart disease, stroke and respiratory failure. The options available for help with cessation ,the behavioral changes to assist the process, and the option to either gradully reduce usage  Or abruptly stop.is also discussed. Pt is also encouraged to set specific goals in number of cigarettes used daily, as well as to set a quit date.  

## 2014-05-12 NOTE — Assessment & Plan Note (Addendum)
Un Controlled, importance of commitment to daily medication iss tressed. Pt to add saline nasal flushes also

## 2014-05-12 NOTE — Progress Notes (Signed)
   Subjective:    Patient ID: Carrie Perez, female    DOB: June 29, 1961, 53 y.o.   MRN: [id number]  HPI The PT is here for follow up and re-evaluation of chronic medical conditions, medication management and review of any available recent lab and radiology data.  Preventive health is updated, specifically  Cancer screening and Immunization.   The PT denies any adverse reactions to current medications since the last visit.  She is concerned about her weight, wants to gain weight, is increasingly anxious about this and also has generalized anxiety wih difficulty sleeping. Relies on xanax regularly, addition of remeron would be beneficial C/o recent flare of allergy symptoms, sneezing, nasal drainage and congestion, itchy watery eyes, denies fever or chills   Review of Systems    See HPI Denies recent fever or chills.  Denies chest congestion, productive cough or wheezing. Denies chest pains, palpitations and leg swelling Denies abdominal pain, nausea, vomiting,diarrhea or constipation.   Denies dysuria, frequency, hesitancy or incontinence. Denies joint pain, swelling and limitation in mobility. Denies headaches, seizures, numbness, or tingling. Denies depression, does c/o increased anxiety and her  Insomnia is unchanged, despite good sleep hygiene, she relies on xanax  At bedtime, thjis has not changed Chronic allopecia unchnaged     Objective:   Physical Exam  BP 98/60  Pulse 80  Resp 18  Ht 5\' 4"  (1.626 m)  Wt 111 lb 0.6 oz (50.367 kg)  BMI 19.05 kg/m2  SpO2 99%  LMP 03/27/2014 Patient alert and oriented and in no cardiopulmonary distress.  HEENT: No facial asymmetry, EOMI,   oropharynx pink and moist.  Neck supple no JVD, no mass. Nasal mucosa erythematous and edematous, mild bilateral conjunctival injection.  Chest: Clear to auscultation bilaterally.  CVS: S1, S2 no murmurs, no S3.Regular rate  ABD: Soft non tender.   Ext: No edema  MS: Adequate ROM spine,  shoulders, hips and knees.  Skin: Intact, no ulcerations or rash noted.Allopecia totalis  Psych: Good eye contact, normal affect. Memory intact mildly  anxious not  depressed appearing.  CNS: CN 2-12 intact, power,  normal throughout.no focal deficits noted.       Assessment & Plan:  ALLERGIC RHINITIS CAUSE UNSPECIFIED Un Controlled, importance of commitment to daily medication iss tressed. Pt to add saline nasal flushes also  HYPERLIPIDEMIA Hyperlipidemia:Low fat diet discussed and encouraged.  Updated lab needed at/ before next visit.   GENERALIZED ANXIETY DISORDER Controlled, no change in medication   Insomnia Sleep hygiene reviewed and written information offered also. Prescription sent for  medication needed. Remeron is chosen to help with appetite as well as mild depression with anxiety  NICOTINE ADDICTION Unchanged Patient counseled for approximately 5 minutes regarding the health risks of ongoing nicotine use, specifically all types of cancer, heart disease, stroke and respiratory failure. The options available for help with cessation ,the behavioral changes to assist the process, and the option to either gradully reduce usage  Or abruptly stop.is also discussed. Pt is also encouraged to set specific goals in number of cigarettes used daily, as well as to set a quit date.

## 2014-05-12 NOTE — Assessment & Plan Note (Signed)
Sleep hygiene reviewed and written information offered also. Prescription sent for  medication needed. Remeron is chosen to help with appetite as well as mild depression with anxiety

## 2014-05-12 NOTE — Assessment & Plan Note (Signed)
Hyperlipidemia:Low fat diet discussed and encouraged.  Updated lab needed at/ before next visit.  

## 2014-06-06 ENCOUNTER — Other Ambulatory Visit: Payer: Self-pay

## 2014-06-06 ENCOUNTER — Telehealth: Payer: Self-pay | Admitting: Family Medicine

## 2014-06-06 MED ORDER — IBUPROFEN 800 MG PO TABS: ORAL_TABLET | ORAL | Status: DC

## 2014-06-06 NOTE — Telephone Encounter (Signed)
Med refilled to [facility]

## 2014-06-07 ENCOUNTER — Other Ambulatory Visit: Payer: Self-pay | Admitting: Family Medicine

## 2014-06-23 ENCOUNTER — Other Ambulatory Visit: Payer: Self-pay | Admitting: Family Medicine

## 2014-08-03 ENCOUNTER — Other Ambulatory Visit: Payer: Self-pay | Admitting: Family Medicine

## 2014-10-31 ENCOUNTER — Other Ambulatory Visit (HOSPITAL_COMMUNITY)
Admission: RE | Admit: 2014-10-31 | Discharge: 2014-10-31 | Disposition: A | Discharge status: Home / Self Care | Payer: BC Managed Care – PPO | Source: Ambulatory Visit | Attending: Family Medicine | Admitting: Family Medicine

## 2014-10-31 ENCOUNTER — Ambulatory Visit (INDEPENDENT_AMBULATORY_CARE_PROVIDER_SITE_OTHER): Payer: BC Managed Care – PPO | Admitting: Family Medicine

## 2014-10-31 ENCOUNTER — Encounter: Payer: Self-pay | Admitting: Family Medicine

## 2014-10-31 VITALS — BP 104/64 | HR 62 | Resp 18 | Ht 64.0 in | Wt 114.1 lb

## 2014-10-31 DIAGNOSIS — R5383 Other fatigue: Secondary | ICD-10-CM

## 2014-10-31 DIAGNOSIS — Z01419 Encounter for gynecological examination (general) (routine) without abnormal findings: Secondary | ICD-10-CM | POA: Diagnosis present

## 2014-10-31 DIAGNOSIS — E785 Hyperlipidemia, unspecified: Secondary | ICD-10-CM

## 2014-10-31 DIAGNOSIS — L630 Alopecia (capitis) totalis: Secondary | ICD-10-CM

## 2014-10-31 DIAGNOSIS — E559 Vitamin D deficiency, unspecified: Secondary | ICD-10-CM

## 2014-10-31 DIAGNOSIS — D509 Iron deficiency anemia, unspecified: Secondary | ICD-10-CM

## 2014-10-31 DIAGNOSIS — N912 Amenorrhea, unspecified: Secondary | ICD-10-CM

## 2014-10-31 DIAGNOSIS — Z124 Encounter for screening for malignant neoplasm of cervix: Secondary | ICD-10-CM

## 2014-10-31 DIAGNOSIS — Z1211 Encounter for screening for malignant neoplasm of colon: Secondary | ICD-10-CM

## 2014-10-31 DIAGNOSIS — Z0000 Encounter for general adult medical examination without abnormal findings: Secondary | ICD-10-CM

## 2014-10-31 DIAGNOSIS — J309 Allergic rhinitis, unspecified: Secondary | ICD-10-CM

## 2014-10-31 LAB — POC HEMOCCULT BLD/STL (OFFICE/1-CARD/DIAGNOSTIC): FECAL OCCULT BLD: NEGATIVE

## 2014-10-31 LAB — POCT URINE PREGNANCY: Preg Test, Ur: NEGATIVE

## 2014-10-31 MED ORDER — PREDNISONE 5 MG PO TABS: 5.0000 mg | ORAL_TABLET | Freq: Two times a day (BID) | ORAL | Status: DC

## 2014-10-31 MED ORDER — MINOXIDIL 2.5 MG PO TABS: 2.5000 mg | ORAL_TABLET | Freq: Every day | ORAL | Status: DC

## 2014-10-31 MED ORDER — METHYLPREDNISOLONE ACETATE 80 MG/ML IJ SUSP
80.0000 mg | Freq: Once | INTRAMUSCULAR | Status: AC
  Administered 2014-10-31: 80 mg via INTRAMUSCULAR

## 2014-10-31 NOTE — Patient Instructions (Addendum)
F/u in 4 month, call if you need me before CBC, fasting lipid, chem7, TSH, and vit D  ass soon as possible  New for hair loss is minoxidil 2.5 mg one daily, if this is not working call for referral to dermatology  For allergies, Depo medrol 80mg  IM in office today followed by prednisone 5mg  twice daily for 5 days  You need colonoscopy  You are perimenopausal  Fatty tumor on mid back can be observed  Perimenopause Perimenopause is the time when your body begins to move into the menopause (no menstrual period for 12 straight months). It is a natural process. Perimenopause can begin 2-8 years before the menopause and usually lasts for 1 year after the menopause. During this time, your ovaries may or may not produce an egg. The ovaries vary in their production of estrogen and progesterone hormones each month. This can cause irregular menstrual periods, difficulty getting pregnant, vaginal bleeding between periods, and uncomfortable symptoms. CAUSES  Irregular production of the ovarian hormones, estrogen and progesterone, and not ovulating every month.  Other causes include:  Tumor of the pituitary gland in the brain.  Medical disease that affects the ovaries.  Radiation treatment.  Chemotherapy.  Unknown causes.  Heavy smoking and excessive alcohol intake can bring on perimenopause sooner. SIGNS AND SYMPTOMS   Hot flashes.  Night sweats.  Irregular menstrual periods.  Decreased sex drive.  Vaginal dryness.  Headaches.  Mood swings.  Depression.  Memory problems.  Irritability.  Tiredness.  Weight gain.  Trouble getting pregnant.  The beginning of losing bone cells (osteoporosis).  The beginning of hardening of the arteries (atherosclerosis). DIAGNOSIS  Your health care provider will make a diagnosis by analyzing your age, menstrual history, and symptoms. He or she will do a physical exam and note any changes in your body, especially your female organs.  Female hormone tests may or may not be helpful depending on the amount of female hormones you produce and when you produce them. However, other hormone tests may be helpful to rule out other problems. TREATMENT  In some cases, no treatment is needed. The decision on whether treatment is necessary during the perimenopause should be made by you and your health care provider based on how the symptoms are affecting you and your lifestyle. Various treatments are available, such as:  Treating individual symptoms with a specific medicine for that symptom.  Herbal medicines that can help specific symptoms.  Counseling.  Group therapy. HOME CARE INSTRUCTIONS   Keep track of your menstrual periods (when they occur, how heavy they are, how long between periods, and how long they last) as well as your symptoms and when they started.  Only take over-the-counter or prescription medicines as directed by your health care provider.  Sleep and rest.  Exercise.  Eat a diet that contains calcium (good for your bones) and soy (acts like the estrogen hormone).  Do not smoke.  Avoid alcoholic beverages.  Take vitamin supplements as recommended by your health care provider. Taking vitamin E may help in certain cases.  Take calcium and vitamin D supplements to help prevent bone loss.  Group therapy is sometimes helpful.  Acupuncture may help in some cases. SEEK MEDICAL CARE IF:   You have questions about any symptoms you are having.  You need a referral to a specialist (gynecologist, psychiatrist, or psychologist). SEEK IMMEDIATE MEDICAL CARE IF:   You have vaginal bleeding.  Your period lasts longer than 8 days.  Your periods are recurring  sooner than 21 days.  You have bleeding after intercourse.  You have severe depression.  You have pain when you urinate.  You have severe headaches.  You have vision problems. Document Released: 12/12/2004 Document Revised: 08/25/2013 Document  Reviewed: 06/03/2013 [facility] Patient Information 2015 [place], [place]. This information is not intended to replace advice given to you by your health care provider. Make sure you discuss any questions you have with your health care provider.

## 2014-10-31 NOTE — Progress Notes (Signed)
   Subjective:    Patient ID: Carrie Perez, female    DOB: 05/12/1961, 53 y.o.   MRN: [id number]  HPI Patient is in for pelvic and breast exam. C/o increased and uncontrolled allergies , with watery eyes, and nose wants "shot for this' Requests medication for help with baldness, as this bothers her. Minoxidil wa working but unable to get it due to prescribed dose  Review of Systems     Objective:   Physical Exam  BP 104/64 mmHg  Pulse 62  Resp 18  Ht 5\' 4"  (1.626 m)  Wt 114 lb 1.3 oz (51.746 kg)  BMI 19.57 kg/m2  SpO2 100% Pleasant well nourished female, alert and oriented x 3, in no cardio-pulmonary distress. Afebrile. HEENT No facial trauma or asymetry. Sinuses non tender.  Extra occullar muscles intact, pupils equally reactive to light. External ears normal, tympanic membranes clear. Oropharynx moist, no exudate,fairly  good dentition. Neck: supple, no adenopathy,JVD or thyromegaly.No bruits. Excessive watering and redness of eyes, and erythema and edema of nasal mucosa with clear drainage Chest: Clear to ascultation bilaterally.No crackles or wheezes. Non tender to palpation  Breast: No asymetry,no masses or lumps. No tenderness. No nipple discharge or inversion. No axillary or supraclavicular adenopathy  Cardiovascular system; Heart sounds normal,  S1 and  S2 ,no S3.  No murmur, or thrill. Apical beat not displaced Peripheral pulses normal.  Abdomen: Soft, non tender, no organomegaly or masses. No bruits. Bowel sounds normal. No guarding, tenderness or rebound.  Rectal:  Normal sphincter tone. No mass.No rectal masses.  Guaiac negative stool.  GU: External genitalia normal female genitalia , female distribution of hair. No lesions. Urethral meatus normal in size, no  Prolapse, no lesions visibly  Present. Bladder non tender. Vagina pink and moist , with no visible lesions , discharge present . Adequate pelvic support no  cystocele or rectocele  noted Cervix pink and appears healthy, no lesions or ulcerations noted, no discharge noted from os Uterus normal size, no adnexal masses, no cervical motion or adnexal tenderness.   Musculoskeletal exam: Full ROM of spine, hips , shoulders and knees. No deformity ,swelling or crepitus noted. No muscle wasting or atrophy.   Neurologic: Cranial nerves 2 to 12 intact. Power, tone ,sensation and reflexes normal throughout. No disturbance in gait. No tremor.  Skin: Intact, no ulceration, erythema , scaling or rash noted. Pigmentation normal throughout [place] due to alopecia, no sign of infection on scalp or skin Psych; Normal mood and affect. Judgement and concentration normal       Assessment & Plan:  Annual physical exam Annual exam as documented. Counseling done  re healthy lifestyle involving commitment to 150 minutes exercise per week, heart healthy diet, and attaining healthy weight.The importance of adequate sleep also discussed. Regular seat belt use and home safety, is also discussed. Changes in health habits are decided on by the patient with goals and time frames  set for achieving them. Immunization and cancer screening needs are specifically addressed at this visit.   Alopecia areata totalis Had response to minoxidil, but medication unavailable at prescribed dose , so will change to lowest dose available pt understands she will need to take med every day  Allergic rhinitis Uncotrolled, depo medrol in office followed by short course of prednisone then daily certrizine

## 2014-11-02 LAB — CYTOLOGY - PAP

## 2014-11-08 DIAGNOSIS — Z0000 Encounter for general adult medical examination without abnormal findings: Secondary | ICD-10-CM | POA: Insufficient documentation

## 2014-11-08 NOTE — Assessment & Plan Note (Signed)
Had response to minoxidil, but medication unavailable at prescribed dose , so will change to lowest dose available pt understands she will need to take med every day

## 2014-11-08 NOTE — Assessment & Plan Note (Signed)
Uncotrolled, depo medrol in office followed by short course of prednisone then daily certrizine

## 2014-11-08 NOTE — Assessment & Plan Note (Signed)

## 2014-12-04 ENCOUNTER — Other Ambulatory Visit: Payer: Self-pay | Admitting: Family Medicine

## 2015-01-10 ENCOUNTER — Other Ambulatory Visit: Payer: Self-pay

## 2015-01-10 ENCOUNTER — Telehealth: Payer: Self-pay | Admitting: *Deleted

## 2015-01-10 MED ORDER — IBUPROFEN 800 MG PO TABS: ORAL_TABLET | ORAL | Status: AC

## 2015-01-10 NOTE — Telephone Encounter (Signed)
Patient states that she is having clear sinus drainage with cough.  No fever or chills.  Advised patient to take Sudafed and if symptoms continue to go to urgent care for treatment.  Please advise further.

## 2015-01-10 NOTE — Telephone Encounter (Signed)
Patient aware.  Refill of Ibuprofen given.

## 2015-01-10 NOTE — Telephone Encounter (Signed)
Agree remind her to take the flonase and zyrtec every day also pls

## 2015-01-10 NOTE — Telephone Encounter (Signed)
Pt called stating she has a sinus infection and it has been going on for a few days pt stated she felt so bad last night she had to leave work early. Pt would like something for relief for it. Please advise pt.

## 2015-01-18 ENCOUNTER — Emergency Department (HOSPITAL_COMMUNITY)
Admission: EM | Admit: 2015-01-18 | Discharge: 2015-01-18 | Disposition: A | Discharge status: Home / Self Care | Payer: BLUE CROSS/BLUE SHIELD | Attending: Emergency Medicine | Admitting: Emergency Medicine

## 2015-01-18 ENCOUNTER — Other Ambulatory Visit: Payer: Self-pay | Admitting: Family Medicine

## 2015-01-18 ENCOUNTER — Emergency Department (HOSPITAL_COMMUNITY): Payer: BLUE CROSS/BLUE SHIELD

## 2015-01-18 ENCOUNTER — Encounter (HOSPITAL_COMMUNITY): Payer: Self-pay | Admitting: Emergency Medicine

## 2015-01-18 DIAGNOSIS — D649 Anemia, unspecified: Secondary | ICD-10-CM | POA: Insufficient documentation

## 2015-01-18 DIAGNOSIS — Z3202 Encounter for pregnancy test, result negative: Secondary | ICD-10-CM | POA: Insufficient documentation

## 2015-01-18 DIAGNOSIS — Y929 Unspecified place or not applicable: Secondary | ICD-10-CM | POA: Diagnosis not present

## 2015-01-18 DIAGNOSIS — Z79899 Other long term (current) drug therapy: Secondary | ICD-10-CM | POA: Insufficient documentation

## 2015-01-18 DIAGNOSIS — X58XXXA Exposure to other specified factors, initial encounter: Secondary | ICD-10-CM | POA: Diagnosis not present

## 2015-01-18 DIAGNOSIS — S29019A Strain of muscle and tendon of unspecified wall of thorax, initial encounter: Secondary | ICD-10-CM

## 2015-01-18 DIAGNOSIS — Z880 Allergy status to penicillin: Secondary | ICD-10-CM | POA: Insufficient documentation

## 2015-01-18 DIAGNOSIS — Z8639 Personal history of other endocrine, nutritional and metabolic disease: Secondary | ICD-10-CM | POA: Insufficient documentation

## 2015-01-18 DIAGNOSIS — Z720 Tobacco use: Secondary | ICD-10-CM | POA: Diagnosis not present

## 2015-01-18 DIAGNOSIS — Y939 Activity, unspecified: Secondary | ICD-10-CM | POA: Insufficient documentation

## 2015-01-18 DIAGNOSIS — S39012A Strain of muscle, fascia and tendon of lower back, initial encounter: Secondary | ICD-10-CM | POA: Diagnosis not present

## 2015-01-18 DIAGNOSIS — Z8781 Personal history of (healed) traumatic fracture: Secondary | ICD-10-CM | POA: Insufficient documentation

## 2015-01-18 DIAGNOSIS — Y999 Unspecified external cause status: Secondary | ICD-10-CM | POA: Insufficient documentation

## 2015-01-18 DIAGNOSIS — R52 Pain, unspecified: Secondary | ICD-10-CM

## 2015-01-18 DIAGNOSIS — Z7952 Long term (current) use of systemic steroids: Secondary | ICD-10-CM | POA: Diagnosis not present

## 2015-01-18 DIAGNOSIS — M545 Low back pain: Secondary | ICD-10-CM | POA: Diagnosis present

## 2015-01-18 LAB — CBC
HEMATOCRIT: 39.4 % (ref 36.0–46.0)
Hemoglobin: 13.1 g/dL (ref 12.0–15.0)
MCH: 29.5 pg (ref 26.0–34.0)
MCHC: 33.2 g/dL (ref 30.0–36.0)
MCV: 88.7 fL (ref 78.0–100.0)
MPV: 9.9 fL (ref 9.4–12.4)
Platelets: 240 10*3/uL (ref 150–400)
RBC: 4.44 MIL/uL (ref 3.87–5.11)
RDW: 13.9 % (ref 11.5–15.5)
WBC: 5.3 10*3/uL (ref 4.0–10.5)

## 2015-01-18 LAB — URINALYSIS, ROUTINE W REFLEX MICROSCOPIC
BILIRUBIN URINE: NEGATIVE
Glucose, UA: NEGATIVE mg/dL
HGB URINE DIPSTICK: NEGATIVE
Ketones, ur: NEGATIVE mg/dL
Leukocytes, UA: NEGATIVE
NITRITE: NEGATIVE
PROTEIN: NEGATIVE mg/dL
Specific Gravity, Urine: 1.015 (ref 1.005–1.030)
Urobilinogen, UA: 0.2 mg/dL (ref 0.0–1.0)
pH: 6 (ref 5.0–8.0)

## 2015-01-18 LAB — POC URINE PREG, ED: Preg Test, Ur: NEGATIVE

## 2015-01-18 LAB — BASIC METABOLIC PANEL
BUN: 19 mg/dL (ref 6–23)
CALCIUM: 10.2 mg/dL (ref 8.4–10.5)
CO2: 29 mEq/L (ref 19–32)
CREATININE: 0.86 mg/dL (ref 0.50–1.10)
Chloride: 103 mEq/L (ref 96–112)
Glucose, Bld: 77 mg/dL (ref 70–99)
Potassium: 4.4 mEq/L (ref 3.5–5.3)
Sodium: 140 mEq/L (ref 135–145)

## 2015-01-18 LAB — TSH: TSH: 2.173 u[IU]/mL (ref 0.350–4.500)

## 2015-01-18 LAB — LIPID PANEL
CHOL/HDL RATIO: 3.6 ratio
Cholesterol: 226 mg/dL — ABNORMAL HIGH (ref 0–200)
HDL: 62 mg/dL (ref >=46)
LDL CALC: 155 mg/dL — AB (ref 0–99)
Triglycerides: 44 mg/dL (ref <150)
VLDL: 9 mg/dL (ref 0–40)

## 2015-01-18 MED ORDER — IBUPROFEN 800 MG PO TABS
800.0000 mg | ORAL_TABLET | Freq: Once | ORAL | Status: AC
  Administered 2015-01-18: 800 mg via ORAL
  Filled 2015-01-18: qty 1
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
## 2015-01-18 NOTE — Discharge Instructions (Signed)

## 2015-01-18 NOTE — ED Notes (Signed)
Low back with burning sensation.

## 2015-01-19 LAB — VITAMIN D 25 HYDROXY (VIT D DEFICIENCY, FRACTURES): Vit D, 25-Hydroxy: 42 ng/mL (ref 30–100)

## 2015-01-24 ENCOUNTER — Ambulatory Visit (INDEPENDENT_AMBULATORY_CARE_PROVIDER_SITE_OTHER): Payer: BLUE CROSS/BLUE SHIELD | Admitting: Family Medicine

## 2015-01-24 VITALS — BP 110/62 | HR 88 | Resp 16 | Ht 64.0 in | Wt 110.4 lb

## 2015-01-24 DIAGNOSIS — Z720 Tobacco use: Secondary | ICD-10-CM

## 2015-01-24 DIAGNOSIS — F17200 Nicotine dependence, unspecified, uncomplicated: Secondary | ICD-10-CM

## 2015-01-24 DIAGNOSIS — L630 Alopecia (capitis) totalis: Secondary | ICD-10-CM

## 2015-01-24 DIAGNOSIS — M549 Dorsalgia, unspecified: Secondary | ICD-10-CM

## 2015-01-24 DIAGNOSIS — R232 Flushing: Secondary | ICD-10-CM

## 2015-01-24 DIAGNOSIS — F411 Generalized anxiety disorder: Secondary | ICD-10-CM

## 2015-01-24 DIAGNOSIS — G4700 Insomnia, unspecified: Secondary | ICD-10-CM

## 2015-01-24 DIAGNOSIS — E785 Hyperlipidemia, unspecified: Secondary | ICD-10-CM

## 2015-01-24 DIAGNOSIS — N951 Menopausal and female climacteric states: Secondary | ICD-10-CM

## 2015-01-24 LAB — IRON: IRON: 155 ug/dL — AB (ref 42–145)

## 2015-01-24 LAB — FERRITIN: FERRITIN: 64 ng/mL (ref 10–291)

## 2015-01-24 LAB — VITAMIN B12: VITAMIN B 12: 984 pg/mL — AB (ref 211–911)

## 2015-01-24 MED ORDER — METHYLPREDNISOLONE ACETATE 80 MG/ML IJ SUSP
80.0000 mg | Freq: Once | INTRAMUSCULAR | Status: AC
  Administered 2015-01-24: 80 mg via INTRAMUSCULAR

## 2015-01-24 MED ORDER — PAROXETINE HCL 10 MG PO TABS: 10.0000 mg | ORAL_TABLET | Freq: Every day | ORAL | Status: DC

## 2015-01-24 MED ORDER — KETOROLAC TROMETHAMINE 60 MG/2ML IM SOLN
60.0000 mg | Freq: Once | INTRAMUSCULAR | Status: AC
  Administered 2015-01-24: 60 mg via INTRAMUSCULAR

## 2015-01-24 MED ORDER — PREDNISONE 5 MG PO TABS: 5.0000 mg | ORAL_TABLET | Freq: Every day | ORAL | Status: DC

## 2015-01-24 NOTE — Patient Instructions (Addendum)
F/u in 6 weeks,  Call if you  Need me before  Injections today for back pain and prednisone dose pack  New med for menopause symptoms paxil one daily  Additional labs are added ( Not HIV)  pls get colonoscopy done

## 2015-02-01 ENCOUNTER — Telehealth: Payer: Self-pay | Admitting: Family Medicine

## 2015-02-01 MED ORDER — BUSPIRONE HCL 5 MG PO TABS: 5.0000 mg | ORAL_TABLET | Freq: Three times a day (TID) | ORAL | Status: DC

## 2015-02-01 NOTE — Addendum Note (Signed)
Addended by: Eual Fines on: 02/01/2015 02:10 PM   Modules accepted: Orders

## 2015-02-01 NOTE — Telephone Encounter (Signed)
Spoke with patient and she is aware  

## 2015-02-01 NOTE — Telephone Encounter (Signed)
I sent a msg to dr but has not heard back as of yet

## 2015-02-01 NOTE — Telephone Encounter (Signed)
No record of calling patient.

## 2015-02-05 ENCOUNTER — Encounter: Payer: Self-pay | Admitting: Family Medicine

## 2015-02-05 NOTE — Assessment & Plan Note (Signed)

## 2015-02-05 NOTE — Assessment & Plan Note (Signed)
Acute flare,  Despite recent Ed visit, still significantly symptomatic Uncontrolled.Toradol and depo medrol administered IM in the office , to be followed by a short course of oral prednisone and NSAIDS.

## 2015-02-05 NOTE — Assessment & Plan Note (Signed)
Sleep hygiene reviewed and written information offered also. Prescription sent for  medication needed.  

## 2015-02-05 NOTE — Assessment & Plan Note (Signed)
Uncontrolled, worsening as perimenopausal symptoms progress, start daily paxil, pt has been intolerant of effexor in the past Encouraged commitment to daily exercise and developing habits that  Enable her to relax

## 2015-02-05 NOTE — Assessment & Plan Note (Signed)
Uncontrolled Hyperlipidemia:Low fat diet discussed and encouraged.  CMP Latest Ref Rng 01/18/2015 09/21/2013 03/28/2013  Glucose 70 - 99 mg/dL 77 - 79  BUN 6 - 23 mg/dL 19 - 16  Creatinine 0.50 - 1.10 mg/dL 0.86 - 0.80  Sodium 135 - 145 mEq/L 140 - 139  Potassium 3.5 - 5.3 mEq/L 4.4 - 3.8  Chloride 96 - 112 mEq/L 103 - 102  CO2 19 - 32 mEq/L 29 - 26  Calcium 8.4 - 10.5 mg/dL 10.2 - 9.8  Total Protein 6.0 - 8.3 g/dL - 7.3 7.2  Total Bilirubin 0.3 - 1.2 mg/dL - 1.0 0.6  Alkaline Phos 39 - 117 U/L - 48 46  AST 0 - 37 U/L - 19 15  ALT 0 - 35 U/L - 8 7    Lipid Panel     Component Value Date/Time   CHOL 226* 01/18/2015 0806   TRIG 44 01/18/2015 0806   HDL 62 01/18/2015 0806   CHOLHDL 3.6 01/18/2015 0806   VLDL 9 01/18/2015 0806   LDLCALC 155* 01/18/2015 0806

## 2015-02-05 NOTE — Assessment & Plan Note (Signed)
Continue minoxidil, orally, sti;ll has significant alopecia, wears a wig most of the time

## 2015-02-05 NOTE — Assessment & Plan Note (Signed)
Reports excess hot flashes despite behavior modification., paxil started

## 2015-02-05 NOTE — Progress Notes (Signed)
Subjective:    Patient ID: Carrie Perez, female    DOB: August 13, 1961, 54 y.o.   MRN: [id number]  HPI  Pt in for Ed follow up for increased back iian and spasm, no significant aggravating factor noted, denies incontinence, or lower extremity weakness or sensory change . Still needs her colonoscopy, and states she will get this done C/o uncontrolled hot flashes , racing thoughts and anxiety , with significant fatigue, little energy to do much of anything in her home after work Depression screen is negative and she is neither suicidal or homicidal, suffers from extreme uncontrolled anxiety which is negatively affected by her poor sleep Review of Systems See HPI Denies recent fever or chills. Denies sinus pressure, nasal congestion, ear pain or sore throat. Denies chest congestion, productive cough or wheezing. Denies chest pains, palpitations and leg swelling Denies abdominal pain, nausea, vomiting,diarrhea or constipation.   Denies dysuria, frequency, hesitancy or incontinence.  Denies headaches, seizures, numbness, or tingling.  Denies skin break down or rash.        Objective:   Physical Exam BP 110/62 mmHg  Pulse 88  Resp 16  Ht 5\' 4"  (1.626 m)  Wt 110 lb 6.4 oz (50.077 kg)  BMI 18.94 kg/m2  SpO2 98%  LMP 11/30/2014 Patient alert and oriented and in no cardiopulmonary distress.  HEENT: No facial asymmetry, EOMI,   oropharynx pink and moist.  Neck supple no JVD, no mass.  Chest: Clear to auscultation bilaterally.  CVS: S1, S2 no murmurs, no S3.Regular rate.  ABD: Soft non tender.   Ext: No edema  MS: Adequate though reduced  ROM lumbar spine, adequate in  shoulders, hips and knees.  Skin: Intact, no ulcerations or rash noted.  Psych: Good eye contact,. Memory intact anxious not  depressed appearing.  CNS: CN 2-12 intact, power,  normal throughout.no focal deficits noted.       Assessment & Plan:  GENERALIZED ANXIETY DISORDER Uncontrolled, worsening as  perimenopausal symptoms progress, start daily paxil, pt has been intolerant of effexor in the past Encouraged commitment to daily exercise and developing habits that  Enable her to relax   Hot flashes Reports excess hot flashes despite behavior modification., paxil started   NICOTINE ADDICTION Patient counseled for approximately 5 minutes regarding the health risks of ongoing nicotine use, specifically all types of cancer, heart disease, stroke and respiratory failure. The options available for help with cessation ,the behavioral changes to assist the process, and the option to either gradully reduce usage  Or abruptly stop.is also discussed. Pt is also encouraged to set specific goals in number of cigarettes used daily, as well as to set a quit date.  Number of cigarettes/cigars currently smoking daily: 10    Insomnia Sleep hygiene reviewed and written information offered also. Prescription sent for  medication needed.    Back pain Acute flare,  Despite recent Ed visit, still significantly symptomatic Uncontrolled.Toradol and depo medrol administered IM in the office , to be followed by a short course of oral prednisone and NSAIDS.    Hyperlipemia Uncontrolled Hyperlipidemia:Low fat diet discussed and encouraged.  CMP Latest Ref Rng 01/18/2015 09/21/2013 03/28/2013  Glucose 70 - 99 mg/dL 77 - 79  BUN 6 - 23 mg/dL 19 - 16  Creatinine 0.50 - 1.10 mg/dL 0.86 - 0.80  Sodium 135 - 145 mEq/L 140 - 139  Potassium 3.5 - 5.3 mEq/L 4.4 - 3.8  Chloride 96 - 112 mEq/L 103 - 102  CO2 19 - 32  mEq/L 29 - 26  Calcium 8.4 - 10.5 mg/dL 10.2 - 9.8  Total Protein 6.0 - 8.3 g/dL - 7.3 7.2  Total Bilirubin 0.3 - 1.2 mg/dL - 1.0 0.6  Alkaline Phos 39 - 117 U/L - 48 46  AST 0 - 37 U/L - 19 15  ALT 0 - 35 U/L - 8 7    Lipid Panel     Component Value Date/Time   CHOL 226* 01/18/2015 0806   TRIG 44 01/18/2015 0806   HDL 62 01/18/2015 0806   CHOLHDL 3.6 01/18/2015 0806   VLDL 9 01/18/2015  0806   LDLCALC 155* 01/18/2015 0806         Alopecia areata totalis Continue minoxidil, orally, sti;ll has significant alopecia, wears a wig most of the time

## 2015-02-13 ENCOUNTER — Other Ambulatory Visit: Payer: Self-pay

## 2015-02-13 ENCOUNTER — Telehealth: Payer: Self-pay | Admitting: Family Medicine

## 2015-02-13 MED ORDER — GABAPENTIN 300 MG PO CAPS: 300.0000 mg | ORAL_CAPSULE | Freq: Every day | ORAL | Status: DC

## 2015-02-13 NOTE — Telephone Encounter (Signed)
Per dr, will send gabapentin 300 1 qhs and pain cream to [facility]. Pt aware

## 2015-02-13 NOTE — Telephone Encounter (Signed)
C/o having burning nerve pain in her back- rates an 8 and she spoke to apothecary aand they recommended a pain cream and gabapentin. Wants to know if they can be sent in for her because she has to work tonight and needs something for pain

## 2015-02-27 ENCOUNTER — Other Ambulatory Visit: Payer: Self-pay

## 2015-02-27 MED ORDER — ALPRAZOLAM 0.25 MG PO TABS: ORAL_TABLET | ORAL | Status: DC

## 2015-03-08 ENCOUNTER — Ambulatory Visit: Payer: BC Managed Care – PPO | Admitting: Family Medicine

## 2015-05-23 ENCOUNTER — Telehealth: Payer: Self-pay | Admitting: *Deleted

## 2015-05-23 ENCOUNTER — Other Ambulatory Visit: Payer: Self-pay

## 2015-05-23 MED ORDER — BUSPIRONE HCL 5 MG PO TABS: 5.0000 mg | ORAL_TABLET | Freq: Three times a day (TID) | ORAL | Status: DC

## 2015-05-23 MED ORDER — GABAPENTIN 300 MG PO CAPS: 300.0000 mg | ORAL_CAPSULE | Freq: Every day | ORAL | Status: DC

## 2015-05-23 MED ORDER — CETIRIZINE HCL 10 MG PO TABS: 10.0000 mg | ORAL_TABLET | Freq: Every day | ORAL | Status: DC

## 2015-05-23 MED ORDER — PAROXETINE HCL 10 MG PO TABS: 10.0000 mg | ORAL_TABLET | Freq: Every day | ORAL | Status: DC

## 2015-05-23 MED ORDER — MINOXIDIL 2.5 MG PO TABS: 2.5000 mg | ORAL_TABLET | Freq: Every day | ORAL | Status: DC

## 2015-05-23 NOTE — Telephone Encounter (Signed)
Medications refilled

## 2015-05-23 NOTE — Telephone Encounter (Signed)
Pt called stating Carrie Perez is requesting lexapram and her zertex to be refilled. Please advise

## 2015-05-29 ENCOUNTER — Other Ambulatory Visit: Payer: Self-pay | Admitting: Family Medicine

## 2015-05-29 DIAGNOSIS — Z1231 Encounter for screening mammogram for malignant neoplasm of breast: Secondary | ICD-10-CM

## 2015-06-01 ENCOUNTER — Ambulatory Visit (HOSPITAL_COMMUNITY)
Admission: RE | Admit: 2015-06-01 | Discharge: 2015-06-01 | Disposition: A | Discharge status: Home / Self Care | Payer: BLUE CROSS/BLUE SHIELD | Source: Ambulatory Visit | Attending: Family Medicine | Admitting: Family Medicine

## 2015-06-01 DIAGNOSIS — Z1231 Encounter for screening mammogram for malignant neoplasm of breast: Secondary | ICD-10-CM | POA: Insufficient documentation

## 2015-06-27 ENCOUNTER — Other Ambulatory Visit: Payer: Self-pay | Admitting: Family Medicine

## 2015-06-27 DIAGNOSIS — R928 Other abnormal and inconclusive findings on diagnostic imaging of breast: Secondary | ICD-10-CM

## 2015-06-29 ENCOUNTER — Other Ambulatory Visit: Payer: Self-pay | Admitting: Family Medicine

## 2015-06-29 DIAGNOSIS — R928 Other abnormal and inconclusive findings on diagnostic imaging of breast: Secondary | ICD-10-CM

## 2015-07-11 ENCOUNTER — Ambulatory Visit (HOSPITAL_COMMUNITY)
Admission: RE | Admit: 2015-07-11 | Discharge: 2015-07-11 | Disposition: A | Discharge status: Home / Self Care | Payer: BLUE CROSS/BLUE SHIELD | Source: Ambulatory Visit | Attending: Family Medicine | Admitting: Family Medicine

## 2015-07-11 DIAGNOSIS — R928 Other abnormal and inconclusive findings on diagnostic imaging of breast: Secondary | ICD-10-CM | POA: Diagnosis not present

## 2015-08-30 ENCOUNTER — Other Ambulatory Visit: Payer: Self-pay | Admitting: Family Medicine

## 2015-09-11 ENCOUNTER — Telehealth: Payer: Self-pay

## 2015-09-11 ENCOUNTER — Other Ambulatory Visit: Payer: Self-pay

## 2015-09-11 NOTE — Telephone Encounter (Signed)
Needs to take the buspar as prescribed and to sched a f/u and keep to address these uncontrolled symptoms

## 2015-09-22 ENCOUNTER — Encounter (HOSPITAL_COMMUNITY): Payer: Self-pay | Admitting: Emergency Medicine

## 2015-09-22 ENCOUNTER — Emergency Department (HOSPITAL_COMMUNITY)
Admission: EM | Admit: 2015-09-22 | Discharge: 2015-09-22 | Disposition: A | Discharge status: Home / Self Care | Payer: BLUE CROSS/BLUE SHIELD | Attending: Emergency Medicine | Admitting: Emergency Medicine

## 2015-09-22 ENCOUNTER — Emergency Department (HOSPITAL_COMMUNITY): Payer: BLUE CROSS/BLUE SHIELD

## 2015-09-22 ENCOUNTER — Other Ambulatory Visit: Payer: Self-pay

## 2015-09-22 DIAGNOSIS — R079 Chest pain, unspecified: Secondary | ICD-10-CM | POA: Insufficient documentation

## 2015-09-22 DIAGNOSIS — Z7951 Long term (current) use of inhaled steroids: Secondary | ICD-10-CM | POA: Insufficient documentation

## 2015-09-22 DIAGNOSIS — Z8639 Personal history of other endocrine, nutritional and metabolic disease: Secondary | ICD-10-CM | POA: Diagnosis not present

## 2015-09-22 DIAGNOSIS — Z720 Tobacco use: Secondary | ICD-10-CM | POA: Diagnosis not present

## 2015-09-22 DIAGNOSIS — R202 Paresthesia of skin: Secondary | ICD-10-CM | POA: Insufficient documentation

## 2015-09-22 DIAGNOSIS — Z880 Allergy status to penicillin: Secondary | ICD-10-CM | POA: Diagnosis not present

## 2015-09-22 DIAGNOSIS — D649 Anemia, unspecified: Secondary | ICD-10-CM | POA: Insufficient documentation

## 2015-09-22 DIAGNOSIS — Z8781 Personal history of (healed) traumatic fracture: Secondary | ICD-10-CM | POA: Insufficient documentation

## 2015-09-22 DIAGNOSIS — M546 Pain in thoracic spine: Secondary | ICD-10-CM | POA: Diagnosis not present

## 2015-09-22 DIAGNOSIS — Z79899 Other long term (current) drug therapy: Secondary | ICD-10-CM | POA: Diagnosis not present

## 2015-09-22 DIAGNOSIS — Z7952 Long term (current) use of systemic steroids: Secondary | ICD-10-CM | POA: Insufficient documentation

## 2015-09-22 LAB — I-STAT CHEM 8, ED
BUN: 18 mg/dL (ref 6–20)
CHLORIDE: 104 mmol/L (ref 101–111)
Calcium, Ion: 1.22 mmol/L (ref 1.12–1.23)
Creatinine, Ser: 1 mg/dL (ref 0.44–1.00)
Glucose, Bld: 88 mg/dL (ref 65–99)
HEMATOCRIT: 37 % (ref 36.0–46.0)
Hemoglobin: 12.6 g/dL (ref 12.0–15.0)
Potassium: 4.2 mmol/L (ref 3.5–5.1)
Sodium: 141 mmol/L (ref 135–145)
TCO2: 26 mmol/L (ref 0–100)

## 2015-09-22 LAB — D-DIMER, QUANTITATIVE: D-Dimer, Quant: 1.32 ug/mL-FEU — ABNORMAL HIGH (ref 0.00–0.48)

## 2015-09-22 LAB — TROPONIN I: Troponin I: <0.03 ng/mL (ref <0.031)

## 2015-09-22 MED ORDER — NAPROXEN 500 MG PO TABS: 500.0000 mg | ORAL_TABLET | Freq: Two times a day (BID) | ORAL | Status: DC

## 2015-09-22 MED ORDER — METHOCARBAMOL 500 MG PO TABS
750.0000 mg | ORAL_TABLET | Freq: Three times a day (TID) | ORAL | Status: DC
  Administered 2015-09-22: 750 mg via ORAL
  Filled 2015-09-22: qty 2

## 2015-09-22 MED ORDER — KETOROLAC TROMETHAMINE 30 MG/ML IJ SOLN
30.0000 mg | Freq: Once | INTRAMUSCULAR | Status: AC
  Administered 2015-09-22: 30 mg via INTRAMUSCULAR
  Filled 2015-09-22: qty 1

## 2015-09-22 MED ORDER — CYCLOBENZAPRINE HCL 10 MG PO TABS: 10.0000 mg | ORAL_TABLET | Freq: Two times a day (BID) | ORAL | Status: DC | PRN

## 2015-09-22 MED ORDER — IOHEXOL 350 MG/ML SOLN
100.0000 mL | Freq: Once | INTRAVENOUS | Status: AC | PRN
  Administered 2015-09-22: 100 mL via INTRAVENOUS

## 2015-09-22 NOTE — Discharge Instructions (Signed)
Back Pain, Adult There is no evidence of heart attack or blood clot in the lung. Followup with your doctor. Return to the ED if you develop new or worsening symptoms. Back pain is very common in adults.The cause of back pain is rarely dangerous and the pain often gets better over time.The cause of your back pain may not be known. Some common causes of back pain include:  Strain of the muscles or ligaments supporting the spine.  Wear and tear (degeneration) of the spinal disks.  Arthritis.  Direct injury to the back. For many people, back pain may return. Since back pain is rarely dangerous, most people can learn to manage this condition on their own. HOME CARE INSTRUCTIONS Watch your back pain for any changes. The following actions may help to lessen any discomfort you are feeling:  Remain active. It is stressful on your back to sit or stand in one place for long periods of time. Do not sit, drive, or stand in one place for more than 30 minutes at a time. Take short walks on even surfaces as soon as you are able.Try to increase the length of time you walk each day.  Exercise regularly as directed by your health care provider. Exercise helps your back heal faster. It also helps avoid future injury by keeping your muscles strong and flexible.  Do not stay in bed.Resting more than 1-2 days can delay your recovery.  Pay attention to your body when you bend and lift. The most comfortable positions are those that put less stress on your recovering back. Always use proper lifting techniques, including:  Bending your knees.  Keeping the load close to your body.  Avoiding twisting.  Find a comfortable position to sleep. Use a firm mattress and lie on your side with your knees slightly bent. If you lie on your back, put a pillow under your knees.  Avoid feeling anxious or stressed.Stress increases muscle tension and can worsen back pain.It is important to recognize when you are anxious or  stressed and learn ways to manage it, such as with exercise.  Take medicines only as directed by your health care provider. Over-the-counter medicines to reduce pain and inflammation are often the most helpful.Your health care provider may prescribe muscle relaxant drugs.These medicines help dull your pain so you can more quickly return to your normal activities and healthy exercise.  Apply ice to the injured area:  Put ice in a plastic bag.  Place a towel between your skin and the bag.  Leave the ice on for 20 minutes, 2-3 times a day for the first 2-3 days. After that, ice and heat may be alternated to reduce pain and spasms.  Maintain a healthy weight. Excess weight puts extra stress on your back and makes it difficult to maintain good posture. SEEK MEDICAL CARE IF:  You have pain that is not relieved with rest or medicine.  You have increasing pain going down into the legs or buttocks.  You have pain that does not improve in one week.  You have night pain.  You lose weight.  You have a fever or chills. SEEK IMMEDIATE MEDICAL CARE IF:   You develop new bowel or bladder control problems.  You have unusual weakness or numbness in your arms or legs.  You develop nausea or vomiting.  You develop abdominal pain.  You feel faint.   This information is not intended to replace advice given to you by your health care provider. Make sure  you discuss any questions you have with your health care provider.   Document Released: 11/04/2005 Document Revised: 11/25/2014 Document Reviewed: 03/08/2014 Elsevier Interactive Patient Education [employer].
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
## 2015-09-22 NOTE — ED Notes (Signed)
Pain to lowere and mid back for 3 or 4 days. Took ibuprofen yesterday, and has used a tens unit with minimal relief. appt with dr Moshe Cipro on Thursday. Did not work last night due to pain.

## 2015-09-22 NOTE — ED Provider Notes (Signed)
Care assumed from Dr. Tomi Bamberger. CT pulmonary embolism study pending for midthoracic back pain and burning in the chest. This was thought to be musculoskeletal.  CT is negative for pulmonary embolism or other acute abnormality. Patient will be treated for musculoskeletal chest pain with anti-inflammatories and muscle relaxers. Follow-up with her PCP. Return precautions discussed.  BP 103/61 mmHg  Pulse 70  Temp(Src) 98.3 F (36.8 C) (Oral)  Resp 21  Ht 5\' 3"  (1.6 m)  Wt 110 lb (49.896 kg)  BMI 19.49 kg/m2  SpO2 100%  LMP 11/30/2014   Ezequiel Essex, MD 09/22/15 [phone]

## 2015-09-28 ENCOUNTER — Ambulatory Visit (INDEPENDENT_AMBULATORY_CARE_PROVIDER_SITE_OTHER): Payer: BLUE CROSS/BLUE SHIELD | Admitting: Family Medicine

## 2015-09-28 ENCOUNTER — Encounter: Payer: Self-pay | Admitting: Family Medicine

## 2015-09-28 VITALS — BP 108/74 | HR 72 | Resp 16 | Ht 63.0 in | Wt 112.8 lb

## 2015-09-28 DIAGNOSIS — L630 Alopecia (capitis) totalis: Secondary | ICD-10-CM

## 2015-09-28 DIAGNOSIS — Z1159 Encounter for screening for other viral diseases: Secondary | ICD-10-CM | POA: Diagnosis not present

## 2015-09-28 DIAGNOSIS — J309 Allergic rhinitis, unspecified: Secondary | ICD-10-CM

## 2015-09-28 DIAGNOSIS — F411 Generalized anxiety disorder: Secondary | ICD-10-CM

## 2015-09-28 DIAGNOSIS — F17200 Nicotine dependence, unspecified, uncomplicated: Secondary | ICD-10-CM

## 2015-09-28 DIAGNOSIS — Z0289 Encounter for other administrative examinations: Secondary | ICD-10-CM

## 2015-09-28 DIAGNOSIS — E785 Hyperlipidemia, unspecified: Secondary | ICD-10-CM | POA: Diagnosis not present

## 2015-09-28 DIAGNOSIS — G4700 Insomnia, unspecified: Secondary | ICD-10-CM

## 2015-09-28 DIAGNOSIS — M546 Pain in thoracic spine: Secondary | ICD-10-CM

## 2015-09-28 MED ORDER — CYCLOBENZAPRINE HCL 10 MG PO TABS: 10.0000 mg | ORAL_TABLET | Freq: Every day | ORAL | Status: DC

## 2015-09-28 MED ORDER — METHYLPREDNISOLONE ACETATE 80 MG/ML IJ SUSP
80.0000 mg | Freq: Once | INTRAMUSCULAR | Status: AC
  Administered 2015-09-28: 80 mg via INTRAMUSCULAR

## 2015-09-28 NOTE — Telephone Encounter (Signed)
Will be addressed at scheduled visit today 11/10

## 2015-09-28 NOTE — Assessment & Plan Note (Signed)

## 2015-09-28 NOTE — Assessment & Plan Note (Addendum)
Intermittent aggravated by upper body movement which she does on the job, 3 ED visits in past 2 years. Needs FMLA to cover job,ppaperwork completed after visit for pt I have explained the need to commit to daily gabapentin and flexeril, massage of back, may use naproxen intermittently

## 2015-09-28 NOTE — Patient Instructions (Signed)
Annual physical exam in 3 month, call if you need me sooner  Commit to daily gabapentin and flexeril for back pain and spasm, take at bedtime , certainly on the days that you work Also encourage massages with muscle rubs, like myoflex  Use naproxen one daily for 2 days only for flare uops  Commit to Buspar 3 times daily for anxiety  Pls work on smoking cessation   Fasting lipid, hepatic, HIV and Hep C for 3 month f/u   Depo medrol today for allergies , will also help back pain Thanks for choosing [facility], we consider it a privelige to serve you.

## 2015-09-28 NOTE — Progress Notes (Signed)
Subjective:    Patient ID: Carrie Perez, female    DOB: 14-Aug-1961, 54 y.o.   MRN: [id number]  HPI Pt in for ED follow up and to address chronic back pain which , during flares , prevents her from carrying out her work.. She has had 2 ED vosits since this  year with this complaint, the pain is aggravated by upper body movement which she does a lot of on her job, which she desperately wants to keep. Still smokes , willing to try to cut back, no quit date set. States also that the medication for anxiety, buspar has been helpful not only for her anxiety, but for hot flashes and sleep and needs this refilled   Review of Systems See HPI Denies recent fever or chills. Denies sinus pressure, c/o excessive  nasal congestion,sneezing and watery eyes, denies ear pain or sore throat. Denies chest congestion, productive cough or wheezing. Denies chest pains, palpitations and leg swelling Denies abdominal pain, nausea, vomiting,diarrhea or constipation.   Denies dysuria, frequency, hesitancy or incontinence.  Denies headaches, seizures, numbness, or tingling.  Denies skin break down or rash. Still has hot flashes , but  Less severe        Objective:   Physical Exam  BP 108/74 mmHg  Pulse 72  Resp 16  Ht 5\' 3"  (1.6 m)  Wt 112 lb 12.8 oz (51.166 kg)  BMI 19.99 kg/m2  SpO2 100%  LMP 11/30/2014 Patient alert and oriented and in no cardiopulmonary distress.  HEENT: No facial asymmetry, EOMI,   oropharynx pink and moist.  Neck supple no JVD, no mass. Watery eyes and excessive clear nasal drainage Chest: Clear to auscultation bilaterally.decreased though adequate  Air entry  CVS: S1, S2 no murmurs, no S3.Regular rate.  ABD: Soft non tender.   Ext: No edema  MS: Adequate though reduced  ROM thoracolumbar  Spine with spasm, normal in shoulders, hips and knees.  Skin: Intact, no ulcerations or rash noted.allopecia totalis , covered with wig  Psych: Good eye contact, normal  affect. Memory intact not anxious or depressed appearing.  CNS: CN 2-12 intact, power,  normal throughout.no focal deficits noted.       Assessment & Plan:  Back pain Intermittent aggravated by upper body movement which she does on the job, 3 ED visits in past 2 years. Needs FMLA to cover job,ppaperwork completed after visit for pt I have explained the need to commit to daily gabapentin and flexeril, massage of back, may use naproxen intermittently  NICOTINE ADDICTION Patient counseled for approximately 5 minutes regarding the health risks of ongoing nicotine use, specifically all types of cancer, heart disease, stroke and respiratory failure. The options available for help with cessation ,the behavioral changes to assist the process, and the option to either gradully reduce usage  Or abruptly stop.is also discussed. Pt is also encouraged to set specific goals in number of cigarettes used daily, as well as to set a quit date.  Number of cigarettes/cigars currently smoking daily:5   Insomnia Sleep hygiene reviewed and written information offered also. Prescription sent for  medication needed.Gabapentin will help, also her muscle relaxant   Hyperlipemia Hyperlipidemia:Low fat diet discussed and encouraged.   Lipid Panel  Lab Results  Component Value Date   CHOL 226* 01/18/2015   HDL 62 01/18/2015   LDLCALC 155* 01/18/2015   TRIG 44 01/18/2015   CHOLHDL 3.6 01/18/2015   Uncontrolled.  Updated lab needed at/ before next visit.  GENERALIZED ANXIETY DISORDER Controlled, no change in medication   Alopecia areata totalis Unchanged, wears wig all the time  Allergic rhinitis Uncontrolled, depo medrol administered at visit, and commitment to daily med stressed

## 2015-10-07 ENCOUNTER — Encounter: Payer: Self-pay | Admitting: Family Medicine

## 2015-10-07 NOTE — Assessment & Plan Note (Signed)
Hyperlipidemia:Low fat diet discussed and encouraged.   Lipid Panel  Lab Results  Component Value Date   CHOL 226* 01/18/2015   HDL 62 01/18/2015   LDLCALC 155* 01/18/2015   TRIG 44 01/18/2015   CHOLHDL 3.6 01/18/2015   Uncontrolled.  Updated lab needed at/ before next visit.

## 2015-10-07 NOTE — Assessment & Plan Note (Signed)
Unchanged, wears wig all the time

## 2015-10-07 NOTE — Assessment & Plan Note (Signed)
Sleep hygiene reviewed and written information offered also. Prescription sent for  medication needed.Gabapentin will help, also her muscle relaxant

## 2015-10-07 NOTE — Assessment & Plan Note (Signed)
Uncontrolled, depo medrol administered at visit, and commitment to daily med stressed

## 2015-10-07 NOTE — Assessment & Plan Note (Signed)
Controlled, no change in medication  

## 2015-10-19 ENCOUNTER — Other Ambulatory Visit: Payer: Self-pay | Admitting: Family Medicine

## 2015-10-19 ENCOUNTER — Telehealth: Payer: Self-pay | Admitting: Family Medicine

## 2015-10-19 MED ORDER — GENTAMICIN SULFATE 0.3 % OP SOLN: OPHTHALMIC | Status: DC

## 2015-10-19 NOTE — Telephone Encounter (Signed)
Patient notified

## 2015-10-19 NOTE — Telephone Encounter (Signed)
Pls let her know antibiotic eye drop sent in, if eye not improved or if worsening, needs to see eye specialist , should be better  In 5 days

## 2015-10-19 NOTE — Telephone Encounter (Signed)
Patient was in the office on 09/28/15 received an allergy shot, her Right eye is red and swollen, eye is running, she is using hot compresses, she is asking if there is something she can use to get it better, please advise?

## 2015-10-30 ENCOUNTER — Telehealth: Payer: Self-pay | Admitting: Family Medicine

## 2015-10-30 MED ORDER — ALPRAZOLAM 0.25 MG PO TABS: ORAL_TABLET | ORAL | Status: DC

## 2015-10-30 MED ORDER — CETIRIZINE HCL 10 MG PO TABS
10.0000 mg | ORAL_TABLET | Freq: Every day | ORAL | Status: DC
Start: 2015-10-30 — End: 2016-06-18

## 2015-10-30 MED ORDER — MINOXIDIL 2.5 MG PO TABS: 2.5000 mg | ORAL_TABLET | Freq: Every day | ORAL | Status: DC

## 2015-10-30 MED ORDER — NAPROXEN 500 MG PO TABS: 500.0000 mg | ORAL_TABLET | Freq: Two times a day (BID) | ORAL | Status: DC

## 2015-10-30 MED ORDER — GABAPENTIN 300 MG PO CAPS: 300.0000 mg | ORAL_CAPSULE | Freq: Every day | ORAL | Status: DC

## 2015-10-30 MED ORDER — BUSPIRONE HCL 5 MG PO TABS: 5.0000 mg | ORAL_TABLET | Freq: Three times a day (TID) | ORAL | Status: DC

## 2015-10-30 MED ORDER — CYCLOBENZAPRINE HCL 10 MG PO TABS: 10.0000 mg | ORAL_TABLET | Freq: Every day | ORAL | Status: DC

## 2015-10-30 NOTE — Telephone Encounter (Signed)
patien is asking for refills on all these medications ALPRAZolam (XANAX) 0.25 MG tablet naproxen (NAPROSYN) 500 MG tablet gabapentin (NEURONTIN) 300 MG capsule minoxidil (LONITEN) 2.5 MG tablet cetirizine (ZYRTEC) 10 MG tablet busPIRone (BUSPAR) 5 MG tablet cyclobenzaprine (FLEXERIL) 10 MG tablet

## 2015-10-30 NOTE — Telephone Encounter (Signed)
meds refilled 

## 2016-01-02 ENCOUNTER — Encounter: Payer: BLUE CROSS/BLUE SHIELD | Admitting: Family Medicine

## 2016-01-13 LAB — LIPID PANEL
CHOLESTEROL: 252 mg/dL — AB (ref 125–200)
HDL: 66 mg/dL (ref >=46)
LDL Cholesterol: 170 mg/dL — ABNORMAL HIGH (ref <130)
TRIGLYCERIDES: 80 mg/dL (ref <150)
Total CHOL/HDL Ratio: 3.8 Ratio (ref <=5.0)
VLDL: 16 mg/dL (ref <30)

## 2016-01-13 LAB — HEPATIC FUNCTION PANEL
ALK PHOS: 71 U/L (ref 33–130)
ALT: 10 U/L (ref 6–29)
AST: 16 U/L (ref 10–35)
Albumin: 4.4 g/dL (ref 3.6–5.1)
BILIRUBIN DIRECT: 0.1 mg/dL (ref <=0.2)
BILIRUBIN INDIRECT: 0.8 mg/dL (ref 0.2–1.2)
TOTAL PROTEIN: 7.6 g/dL (ref 6.1–8.1)
Total Bilirubin: 0.9 mg/dL (ref 0.2–1.2)

## 2016-01-13 LAB — HEPATITIS C ANTIBODY: HCV Ab: NEGATIVE

## 2016-01-14 LAB — HIV ANTIBODY (ROUTINE TESTING W REFLEX): HIV 1&2 Ab, 4th Generation: NONREACTIVE

## 2016-01-19 ENCOUNTER — Ambulatory Visit (INDEPENDENT_AMBULATORY_CARE_PROVIDER_SITE_OTHER): Payer: BLUE CROSS/BLUE SHIELD | Admitting: Family Medicine

## 2016-01-19 ENCOUNTER — Encounter: Payer: Self-pay | Admitting: Family Medicine

## 2016-01-19 VITALS — BP 110/74 | HR 89 | Resp 16 | Ht 63.0 in | Wt 115.0 lb

## 2016-01-19 DIAGNOSIS — F17200 Nicotine dependence, unspecified, uncomplicated: Secondary | ICD-10-CM

## 2016-01-19 DIAGNOSIS — J3089 Other allergic rhinitis: Secondary | ICD-10-CM | POA: Diagnosis not present

## 2016-01-19 DIAGNOSIS — E785 Hyperlipidemia, unspecified: Secondary | ICD-10-CM | POA: Diagnosis not present

## 2016-01-19 DIAGNOSIS — Z0000 Encounter for general adult medical examination without abnormal findings: Secondary | ICD-10-CM | POA: Diagnosis not present

## 2016-01-19 DIAGNOSIS — Z1211 Encounter for screening for malignant neoplasm of colon: Secondary | ICD-10-CM | POA: Diagnosis not present

## 2016-01-19 LAB — POC HEMOCCULT BLD/STL (OFFICE/1-CARD/DIAGNOSTIC): FECAL OCCULT BLD: NEGATIVE

## 2016-01-19 MED ORDER — METHYLPREDNISOLONE ACETATE 80 MG/ML IJ SUSP
80.0000 mg | Freq: Once | INTRAMUSCULAR | Status: AC
  Administered 2016-01-19: 80 mg via INTRAMUSCULAR

## 2016-01-19 MED ORDER — PREDNISONE 5 MG PO TABS
5.0000 mg | ORAL_TABLET | Freq: Two times a day (BID) | ORAL | Status: AC
Start: 2016-01-19 — End: 2016-01-23

## 2016-01-19 NOTE — Assessment & Plan Note (Signed)
Uncontrolled, depo medrol in office and 5 day course of prednisone prescribed

## 2016-01-19 NOTE — Assessment & Plan Note (Signed)

## 2016-01-19 NOTE — Progress Notes (Signed)
Subjective:    Patient ID: Carrie Perez, female    DOB: 1961/03/06, 55 y.o.   MRN: [id number]  HPI Patient is in for annual physical exam. C/o increased and uncontrolled allergy symptoms, excess runny nose, itchy eyes and cough, no fever or chills. Requests steroid injection which has helped in the past. Understands and will commit to daily meds also Recent labs, are reviewed. Immunization is reviewed , and  updated if needed.     Review of Systems See HPI     Objective:   Physical Exam  BP 110/74 mmHg  Pulse 89  Resp 16  Ht 5\' 3"  (1.6 m)  Wt 115 lb (52.164 kg)  BMI 20.38 kg/m2  SpO2 99%  LMP 11/30/2014  Pleasant well nourished female, alert and oriented x 3, in no cardio-pulmonary distress. Afebrile. HEENT No facial trauma or asymetry. Sinuses non tender. Nasal mucosa edematous, clear drainage. Extra occullar muscles intact, pupils equally reactive to light.Conjunctiva erythematous and watery drainage External ears normal, tympanic membranes clear. Oropharynx moist, no exudate,fairly good  dentition. Neck: supple, no adenopathy,JVD or thyromegaly.No bruits.  Chest: Clear to ascultation bilaterally.No crackles or wheezes. Non tender to palpation  Breast: No asymetry,no masses or lumps. No tenderness. No nipple discharge or inversion. No axillary or supraclavicular adenopathy  Cardiovascular system; Heart sounds normal,  S1 and  S2 ,no S3.  No murmur, or thrill. Apical beat not displaced Peripheral pulses normal.  Abdomen: Soft, non tender, no organomegaly or masses. No bruits. Bowel sounds normal. No guarding, tenderness or rebound.  Rectal:  Normal sphincter tone. No mass.No rectal masses.  Guaiac negative stool.  GU: External genitalia normal female genitalia , female distribution of hair. No lesions. Urethral meatus normal in size, no  Prolapse, no lesions visibly  Present. Bladder non tender. Vagina pink and moist , with no visible lesions  , discharge present . Adequate pelvic support no  cystocele or rectocele noted Cervix pink and appears healthy, no lesions or ulcerations noted, no discharge noted from os Uterus normal size, no adnexal masses, no cervical motion or adnexal tenderness.   Musculoskeletal exam: Full ROM of spine, hips , shoulders and knees. No deformity ,swelling or crepitus noted. No muscle wasting or atrophy.   Neurologic: Cranial nerves 2 to 12 intact. Power, tone ,sensation and reflexes normal throughout. No disturbance in gait. No tremor.  Skin: Intact, no ulceration, erythema , scaling or rash noted. Pigmentation normal throughout  Psych; Normal mood and affect. Judgement and concentration normal       Assessment & Plan:  NICOTINE ADDICTION Patient counseled for approximately 5 minutes regarding the health risks of ongoing nicotine use, specifically all types of cancer, heart disease, stroke and respiratory failure. The options available for help with cessation ,the behavioral changes to assist the process, and the option to either gradully reduce usage  Or abruptly stop.is also discussed. Pt is also encouraged to set specific goals in number of cigarettes used daily, as well as to set a quit date.  Number of cigarettes/cigars currently smoking daily: 7   Allergic rhinitis Uncontrolled, depo medrol in office and 5 day course of prednisone prescribed  Annual physical exam Annual exam as documented. Counseling done  re healthy lifestyle involving commitment to 150 minutes exercise per week, heart healthy diet, and attaining healthy weight.The importance of adequate sleep also discussed. Regular seat belt use and home safety, is also discussed. Changes in health habits are decided on by the patient with goals and time  frames  set for achieving them. Immunization and cancer screening needs are specifically addressed at this visit.   Hyperlipemia Deteriorated Hyperlipidemia:Low fat diet  discussed and encouraged.   Lipid Panel  Lab Results  Component Value Date   CHOL 252* 01/13/2016   HDL 66 01/13/2016   LDLCALC 170* 01/13/2016   TRIG 80 01/13/2016   CHOLHDL 3.8 01/13/2016  states she will change diet, no interest in medication at this time

## 2016-01-19 NOTE — Patient Instructions (Addendum)
F/u in 6 month, call if you need me before  Please plan to reduce cigarettes by 1 each month, so that you QUIT entirely  Pls reduce ice cream, cheese, and fatty foods , cholesterol is high, increasing risk of heart disease  Depo medrol in office today for uncontrolled allergies and 5 day course of prednisone is prescribed  CBC, fasting lipid and TSH in 79month 3 weeks  You Can Quit Smoking If you are ready to quit smoking or are thinking about it, congratulations! You have chosen to help yourself be healthier and live longer! There are lots of different ways to quit smoking. Nicotine gum, nicotine patches, a nicotine inhaler, or nicotine nasal spray can help with physical craving. Hypnosis, support groups, and medicines help break the habit of smoking. TIPS TO GET OFF AND STAY OFF CIGARETTES  Learn to predict your moods. Do not let a bad situation be your excuse to have a cigarette. Some situations in your life might tempt you to have a cigarette.  Ask friends and co-workers not to smoke around you.  Make your home smoke-free.  Never have "just one" cigarette. It leads to wanting another and another. Remind yourself of your decision to quit.  On a card, make a list of your reasons for not smoking. Read it at least the same number of times a day as you have a cigarette. Tell yourself everyday, "I do not want to smoke. I choose not to smoke."  Ask someone at home or work to help you with your plan to quit smoking.  Have something planned after you eat or have a cup of coffee. Take a walk or get other exercise to perk you up. This will help to keep you from overeating.  Try a relaxation exercise to calm you down and decrease your stress. Remember, you may be tense and nervous the first two weeks after you quit. This will pass.  Find new activities to keep your hands busy. Play with a pen, coin, or rubber band. Doodle or draw things on paper.  Brush your teeth right after eating. This will  help cut down the craving for the taste of tobacco after meals. You can try mouthwash too.  Try gum, breath mints, or diet candy to keep something in your mouth. IF YOU SMOKE AND WANT TO QUIT:  Do not stock up on cigarettes. Never buy a carton. Wait until one pack is finished before you buy another.  Never carry cigarettes with you at work or at home.  Keep cigarettes as far away from you as possible. Leave them with someone else.  Never carry matches or a lighter with you.  Ask yourself, "Do I need this cigarette or is this just a reflex?"  Bet with someone that you can quit. Put cigarette money in a piggy bank every morning. If you smoke, you give up the money. If you do not smoke, by the end of the week, you keep the money.  Keep trying. It takes 21 days to change a habit!  Talk to your doctor about using medicines to help you quit. These include nicotine replacement gum, lozenges, or skin patches.   This information is not intended to replace advice given to you by your health care provider. Make sure you discuss any questions you have with your health care provider.   Document Released: 08/31/2009 Document Revised: 01/27/2012 Document Reviewed: 08/31/2009 Elsevier Interactive Patient Education [employer]. Colon cancer screening test still needed

## 2016-01-21 NOTE — Assessment & Plan Note (Signed)
Deteriorated Hyperlipidemia:Low fat diet discussed and encouraged.   Lipid Panel  Lab Results  Component Value Date   CHOL 252* 01/13/2016   HDL 66 01/13/2016   LDLCALC 170* 01/13/2016   TRIG 80 01/13/2016   CHOLHDL 3.8 01/13/2016  states she will change diet, no interest in medication at this time

## 2016-01-21 NOTE — Assessment & Plan Note (Signed)

## 2016-04-02 ENCOUNTER — Telehealth: Payer: Self-pay

## 2016-04-02 DIAGNOSIS — M545 Low back pain: Secondary | ICD-10-CM

## 2016-04-02 NOTE — Telephone Encounter (Signed)
OK to refer , I will sign

## 2016-04-02 NOTE — Telephone Encounter (Signed)
Referral sent 

## 2016-04-02 NOTE — Addendum Note (Signed)
Addended by: Eual Fines on: 04/02/2016 04:34 PM   Modules accepted: Orders

## 2016-04-02 NOTE — Telephone Encounter (Signed)
Wants a referral to Dr Aline Brochure for her low back pain. States it has been giving her a problem for a long time and she has been to the ER as well as here about it. Wants referral entered to see [facility]

## 2016-04-29 ENCOUNTER — Ambulatory Visit (INDEPENDENT_AMBULATORY_CARE_PROVIDER_SITE_OTHER): Payer: BLUE CROSS/BLUE SHIELD | Admitting: Orthopedic Surgery

## 2016-04-29 VITALS — BP 119/69 | HR 73 | Ht 63.0 in | Wt 106.0 lb

## 2016-04-29 DIAGNOSIS — M545 Low back pain, unspecified: Secondary | ICD-10-CM
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
## 2016-06-17 ENCOUNTER — Other Ambulatory Visit: Payer: Self-pay | Admitting: Family Medicine

## 2016-07-15 ENCOUNTER — Other Ambulatory Visit: Payer: Self-pay | Admitting: Family Medicine

## 2016-07-15 ENCOUNTER — Telehealth: Payer: Self-pay | Admitting: Family Medicine

## 2016-07-15 MED ORDER — PREDNISONE 5 MG (21) PO TBPK: ORAL_TABLET | ORAL | 0 refills | Status: DC

## 2016-07-15 NOTE — Telephone Encounter (Signed)
Refill x 1 pls 

## 2016-07-15 NOTE — Telephone Encounter (Signed)
Entered and done

## 2016-07-15 NOTE — Telephone Encounter (Signed)
Carrie Perez is stating that her allergies are bothering her real bad and she is asking for a refill on Prednisone, please advise?

## 2016-07-15 NOTE — Telephone Encounter (Signed)
Patient states that she has uncontrolled allergies x 10 days.   Sinus congestion, cough, sneezing. Is taking Cetrizine and Flonase as prescribed.    Verbal order for pred pak received and entered.

## 2016-07-23 ENCOUNTER — Encounter: Payer: Self-pay | Admitting: Family Medicine

## 2016-07-23 ENCOUNTER — Ambulatory Visit (INDEPENDENT_AMBULATORY_CARE_PROVIDER_SITE_OTHER): Payer: BLUE CROSS/BLUE SHIELD | Admitting: Family Medicine

## 2016-07-23 VITALS — BP 110/70 | HR 70 | Resp 16 | Ht 63.0 in | Wt 109.0 lb

## 2016-07-23 DIAGNOSIS — E785 Hyperlipidemia, unspecified: Secondary | ICD-10-CM | POA: Diagnosis not present

## 2016-07-23 DIAGNOSIS — Z1231 Encounter for screening mammogram for malignant neoplasm of breast: Secondary | ICD-10-CM

## 2016-07-23 DIAGNOSIS — J3089 Other allergic rhinitis: Secondary | ICD-10-CM

## 2016-07-23 DIAGNOSIS — M545 Low back pain: Secondary | ICD-10-CM

## 2016-07-23 DIAGNOSIS — S61319A Laceration without foreign body of unspecified finger with damage to nail, initial encounter: Secondary | ICD-10-CM | POA: Insufficient documentation

## 2016-07-23 DIAGNOSIS — F411 Generalized anxiety disorder: Secondary | ICD-10-CM

## 2016-07-23 DIAGNOSIS — F17200 Nicotine dependence, unspecified, uncomplicated: Secondary | ICD-10-CM

## 2016-07-23 DIAGNOSIS — G4700 Insomnia, unspecified: Secondary | ICD-10-CM | POA: Diagnosis not present

## 2016-07-23 MED ORDER — DOXYCYCLINE HYCLATE 100 MG PO TABS: 100.0000 mg | ORAL_TABLET | Freq: Two times a day (BID) | ORAL | 0 refills | Status: DC

## 2016-07-23 MED ORDER — METHYLPREDNISOLONE ACETATE 80 MG/ML IJ SUSP
80.0000 mg | Freq: Once | INTRAMUSCULAR | Status: AC
  Administered 2016-07-23: 80 mg via INTRAMUSCULAR

## 2016-07-23 NOTE — Assessment & Plan Note (Signed)
Hyperlipidemia:Low fat diet discussed and encouraged.   Lipid Panel  Lab Results  Component Value Date   CHOL 252 (H) 01/13/2016   HDL 66 01/13/2016   LDLCALC 170 (H) 01/13/2016   TRIG 80 01/13/2016   CHOLHDL 3.8 01/13/2016   Updated lab needed

## 2016-07-23 NOTE — Assessment & Plan Note (Signed)
Trauma to right mid finger 1 week ago, nail bed appears to be affected, open ulcer still present, 5 day course of doxycycline prescribed, pt to keep area clean and dry , loosely covered

## 2016-07-23 NOTE — Addendum Note (Signed)
Addended by: Eual Fines on: 07/23/2016 09:21 AM   Modules accepted: Orders

## 2016-07-23 NOTE — Patient Instructions (Addendum)
Annual exam March 5 or after , call if you need me sooner  Depo medrol today for allergies   pls notify us when you get flu shot  Fasting labs end September   Need to  STOP smoking, quit date set for Jan 1   Pls schedule mammogram  Colonoscopy is best way to screen for colon cancer, schedule appt with GI to discuss if still unclear as to what tot do, call for referral if needed  5 day course of antibiotic sent for sore on right mid finger  Thank you  for choosing [facility]. We consider it a privelige to serve you.  Delivering excellent health care in a caring and  compassionate way is our goal.  Partnering with you,  so that together we can achieve this goal is our strategy.      You Can Quit Smoking If you are ready to quit smoking or are thinking about it, congratulations! You have chosen to help yourself be healthier and live longer! There are lots of different ways to quit smoking. Nicotine gum, nicotine patches, a nicotine inhaler, or nicotine nasal spray can help with physical craving. Hypnosis, support groups, and medicines help break the habit of smoking. TIPS TO GET OFF AND STAY OFF CIGARETTES  Learn to predict your moods. Do not let a bad situation be your excuse to have a cigarette. Some situations in your life might tempt you to have a cigarette.  Ask friends and co-workers not to smoke around you.  Make your home smoke-free.  Never have "just one" cigarette. It leads to wanting another and another. Remind yourself of your decision to quit.  On a card, make a list of your reasons for not smoking. Read it at least the same number of times a day as you have a cigarette. Tell yourself everyday, "I do not want to smoke. I choose not to smoke."  Ask someone at home or work to help you with your plan to quit smoking.  Have something planned after you eat or have a cup of coffee. Take a walk or get other exercise to perk you up. This will help to keep  you from overeating.  Try a relaxation exercise to calm you down and decrease your stress. Remember, you may be tense and nervous the first two weeks after you quit. This will pass.  Find new activities to keep your hands busy. Play with a pen, coin, or rubber band. Doodle or draw things on paper.  Brush your teeth right after eating. This will help cut down the craving for the taste of tobacco after meals. You can try mouthwash too.  Try gum, breath mints, or diet candy to keep something in your mouth. IF YOU SMOKE AND WANT TO QUIT:  Do not stock up on cigarettes. Never buy a carton. Wait until one pack is finished before you buy another.  Never carry cigarettes with you at work or at home.  Keep cigarettes as far away from you as possible. Leave them with someone else.  Never carry matches or a lighter with you.  Ask yourself, "Do I need this cigarette or is this just a reflex?"  Bet with someone that you can quit. Put cigarette money in a piggy bank every morning. If you smoke, you give up the money. If you do not smoke, by the end of the week, you keep the money.  Keep trying. It takes 21 days to change a habit!  Talk  to your doctor about using medicines to help you quit. These include nicotine replacement gum, lozenges, or skin patches.   This information is not intended to replace advice given to you by your health care provider. Make sure you discuss any questions you have with your health care provider.   Document Released: 08/31/2009 Document Revised: 01/27/2012 Document Reviewed: 08/31/2009 Elsevier Interactive Patient Education [employer].

## 2016-07-23 NOTE — Assessment & Plan Note (Signed)
increased symptoms x 1 week, depo medrol 80 mg iM in office Just completed dose pack

## 2016-07-23 NOTE — Assessment & Plan Note (Signed)
Discontinued citalopram, does not want it back, relying on behavioral  Modification Has limited xanax which is not to be refilled, states smokes under stress also

## 2016-07-23 NOTE — Assessment & Plan Note (Signed)
Sleep hygiene reviewed and written information offered also. Prescription sent for  medication needed. Uses muscle relaxant both for back spasm and sleeep

## 2016-07-23 NOTE — Assessment & Plan Note (Signed)
Patient counseled for approximately 5 minutes regarding the health risks of ongoing nicotine use, specifically all types of cancer, heart disease, stroke and respiratory failure. The options available for help with cessation ,the behavioral changes to assist the process, and the option to either gradully reduce usage  Or abruptly stop.is also discussed. Pt is also encouraged to set specific goals in number of cigarettes used daily, as well as to set a quit date. Plans to quit by Nov 18, 2016

## 2016-07-23 NOTE — Assessment & Plan Note (Signed)
Controlled, no change in medication  

## 2016-07-23 NOTE — Progress Notes (Signed)
Carrie Perez     MRN: [id number]      DOB: 01-25-1961   HPI Carrie Perez is here for follow up and re-evaluation of chronic medical conditions, medication management and review of any available recent lab and radiology data.  Preventive health is updated, specifically  Cancer screening and Immunization.  Still refusing colonoscopy, she is re educated Questions or concerns regarding consultations or procedures which the PT has had in the interim are  addressed. The PT denies any adverse reactions to current medications since the last visit.  Trauma to right mid finger 1 week ago, had purulent drainage, area still swollen Increased stress with her spouse, bad attitude  Since working night shifts, she is dealing with it by ignoring him for the most part  ROS Denies recent fever or chills. Denies sinus pressure, nasal congestion, ear pain or sore throat. Denies chest congestion, productive cough or wheezing. Denies chest pains, palpitations and leg swelling Denies abdominal pain, nausea, vomiting,diarrhea or constipation.   Denies dysuria, frequency, hesitancy or incontinence. Denies joint pain, swelling and limitation in mobility. Denies headaches, seizures, numbness, or tingling. Denies depression,  or insomnia.  PE  BP 110/70   Pulse 70   Resp 16   Ht 5\' 3"  (1.6 m)   Wt 109 lb (49.4 kg)   LMP 11/30/2014   SpO2 99%   BMI 19.31 kg/m   Patient alert and oriented and in no cardiopulmonary distress.  HEENT: No facial asymmetry, EOMI,   oropharynx pink and moist.  Neck supple no JVD, no mass.  Chest: Clear to auscultation bilaterally.decreased air entry  CVS: S1, S2 no murmurs, no S3.Regular rate.  ABD: Soft non tender.   Skin: ulcer on right mid finger, scant drainage noted  Psych: Good eye contact, normal affect. Memory intact not anxious or depressed appearing.  CNS: CN 2-12 intact, power,  normal throughout.no focal deficits noted.   Assessment &  Plan  Hyperlipemia Hyperlipidemia:Low fat diet discussed and encouraged.   Lipid Panel  Lab Results  Component Value Date   CHOL 252 (H) 01/13/2016   HDL 66 01/13/2016   LDLCALC 170 (H) 01/13/2016   TRIG 80 01/13/2016   CHOLHDL 3.8 01/13/2016   Updated lab needed    Allergic rhinitis increased symptoms x 1 week, depo medrol 80 mg iM in office Just completed dose pack  Back pain Controlled, no change in medication   Insomnia Sleep hygiene reviewed and written information offered also. Prescription sent for  medication needed. Uses muscle relaxant both for back spasm and sleeep  NICOTINE ADDICTION Patient counseled for approximately 5 minutes regarding the health risks of ongoing nicotine use, specifically all types of cancer, heart disease, stroke and respiratory failure. The options available for help with cessation ,the behavioral changes to assist the process, and the option to either gradully reduce usage  Or abruptly stop.is also discussed. Pt is also encouraged to set specific goals in number of cigarettes used daily, as well as to set a quit date. Plans to quit by Nov 18, 2016    GENERALIZED ANXIETY DISORDER Discontinued citalopram, does not want it back, relying on behavioral  Modification Has limited xanax which is not to be refilled, states smokes under stress also  Laceration of nail bed of finger Trauma to right mid finger 1 week ago, nail bed appears to be affected, open ulcer still present, 5 day course of doxycycline prescribed, pt to keep area clean and dry , loosely covered

## 2016-08-08 ENCOUNTER — Other Ambulatory Visit: Payer: Self-pay | Admitting: Family Medicine

## 2016-08-08 DIAGNOSIS — Z1231 Encounter for screening mammogram for malignant neoplasm of breast: Secondary | ICD-10-CM

## 2016-08-09 ENCOUNTER — Ambulatory Visit (HOSPITAL_COMMUNITY)
Admission: RE | Admit: 2016-08-09 | Discharge: 2016-08-09 | Disposition: A | Discharge status: Home / Self Care | Payer: BLUE CROSS/BLUE SHIELD | Source: Ambulatory Visit | Attending: Family Medicine | Admitting: Family Medicine

## 2016-08-09 DIAGNOSIS — Z1231 Encounter for screening mammogram for malignant neoplasm of breast: Secondary | ICD-10-CM

## 2016-08-09 DIAGNOSIS — R928 Other abnormal and inconclusive findings on diagnostic imaging of breast: Secondary | ICD-10-CM | POA: Diagnosis not present

## 2016-08-13 ENCOUNTER — Other Ambulatory Visit: Payer: Self-pay | Admitting: Family Medicine

## 2016-08-13 DIAGNOSIS — R928 Other abnormal and inconclusive findings on diagnostic imaging of breast: Secondary | ICD-10-CM

## 2016-08-16 ENCOUNTER — Other Ambulatory Visit: Payer: Self-pay | Admitting: Family Medicine

## 2016-08-16 DIAGNOSIS — R928 Other abnormal and inconclusive findings on diagnostic imaging of breast: Secondary | ICD-10-CM

## 2016-08-19 ENCOUNTER — Other Ambulatory Visit: Payer: Self-pay | Admitting: Family Medicine

## 2016-08-20 ENCOUNTER — Other Ambulatory Visit: Payer: Self-pay | Admitting: Family Medicine

## 2016-08-20 ENCOUNTER — Ambulatory Visit (HOSPITAL_COMMUNITY)
Admission: RE | Admit: 2016-08-20 | Discharge: 2016-08-20 | Disposition: A | Discharge status: Home / Self Care | Payer: BLUE CROSS/BLUE SHIELD | Source: Ambulatory Visit | Attending: Family Medicine | Admitting: Family Medicine

## 2016-08-20 DIAGNOSIS — R922 Inconclusive mammogram: Secondary | ICD-10-CM | POA: Diagnosis not present

## 2016-08-20 DIAGNOSIS — R928 Other abnormal and inconclusive findings on diagnostic imaging of breast: Secondary | ICD-10-CM

## 2016-08-24 DIAGNOSIS — E785 Hyperlipidemia, unspecified: Secondary | ICD-10-CM | POA: Diagnosis not present

## 2016-08-24 LAB — LIPID PANEL
CHOLESTEROL: 247 mg/dL — AB (ref 125–200)
HDL: 63 mg/dL (ref >=46)
LDL Cholesterol: 170 mg/dL — ABNORMAL HIGH (ref <130)
TRIGLYCERIDES: 72 mg/dL (ref <150)
Total CHOL/HDL Ratio: 3.9 Ratio (ref <=5.0)
VLDL: 14 mg/dL (ref <30)

## 2016-08-24 LAB — CBC
HCT: 42.9 % (ref 35.0–45.0)
Hemoglobin: 14.6 g/dL (ref 11.7–15.5)
MCH: 30.4 pg (ref 27.0–33.0)
MCHC: 34 g/dL (ref 32.0–36.0)
MCV: 89.2 fL (ref 80.0–100.0)
MPV: 9.7 fL (ref 7.5–12.5)
Platelets: 301 10*3/uL (ref 140–400)
RBC: 4.81 MIL/uL (ref 3.80–5.10)
RDW: 14.1 % (ref 11.0–15.0)
WBC: 4.6 10*3/uL (ref 3.8–10.8)

## 2016-08-25 LAB — TSH: TSH: 2.79 m[IU]/L

## 2016-08-30 ENCOUNTER — Telehealth: Payer: Self-pay

## 2016-08-30 DIAGNOSIS — E785 Hyperlipidemia, unspecified: Secondary | ICD-10-CM

## 2016-08-30 NOTE — Telephone Encounter (Signed)
Hepatic ordered and pt to do today. Will await results

## 2016-08-30 NOTE — Telephone Encounter (Signed)
-----   Message from Fayrene Helper, MD sent at 08/26/2016  5:59 AM EDT ----- pls explain cholesterol too high, needs to lower fat intake or else heart disease risk is high, recommend statin , pravachol 20 mg daily, if agrees, needs hepatic panel added, before sendong 4 month with rept lab lipid an hepatic in 4 months

## 2016-12-28 ENCOUNTER — Other Ambulatory Visit: Payer: Self-pay | Admitting: Family Medicine

## 2017-01-21 ENCOUNTER — Encounter: Payer: Self-pay | Admitting: Family Medicine

## 2017-01-21 ENCOUNTER — Other Ambulatory Visit (HOSPITAL_COMMUNITY)
Admission: RE | Admit: 2017-01-21 | Discharge: 2017-01-21 | Disposition: A | Discharge status: Home / Self Care | Payer: BLUE CROSS/BLUE SHIELD | Source: Ambulatory Visit | Attending: Family Medicine | Admitting: Family Medicine

## 2017-01-21 ENCOUNTER — Ambulatory Visit (INDEPENDENT_AMBULATORY_CARE_PROVIDER_SITE_OTHER): Payer: BLUE CROSS/BLUE SHIELD | Admitting: Family Medicine

## 2017-01-21 VITALS — BP 110/72 | HR 72 | Resp 15 | Ht 63.0 in | Wt 113.0 lb

## 2017-01-21 DIAGNOSIS — Z23 Encounter for immunization: Secondary | ICD-10-CM | POA: Diagnosis not present

## 2017-01-21 DIAGNOSIS — Z1151 Encounter for screening for human papillomavirus (HPV): Secondary | ICD-10-CM | POA: Diagnosis not present

## 2017-01-21 DIAGNOSIS — Z0000 Encounter for general adult medical examination without abnormal findings: Secondary | ICD-10-CM

## 2017-01-21 DIAGNOSIS — E785 Hyperlipidemia, unspecified: Secondary | ICD-10-CM | POA: Diagnosis not present

## 2017-01-21 DIAGNOSIS — Z1211 Encounter for screening for malignant neoplasm of colon: Secondary | ICD-10-CM

## 2017-01-21 DIAGNOSIS — Z124 Encounter for screening for malignant neoplasm of cervix: Secondary | ICD-10-CM

## 2017-01-21 DIAGNOSIS — F17200 Nicotine dependence, unspecified, uncomplicated: Secondary | ICD-10-CM

## 2017-01-21 DIAGNOSIS — Z01419 Encounter for gynecological examination (general) (routine) without abnormal findings: Secondary | ICD-10-CM | POA: Diagnosis not present

## 2017-01-21 LAB — POC HEMOCCULT BLD/STL (OFFICE/1-CARD/DIAGNOSTIC): Fecal Occult Blood, POC: NEGATIVE

## 2017-01-21 NOTE — Progress Notes (Signed)
    SERETHA IMGRUND     MRN: [id number]      DOB: 18-Dec-1960  HPI: Patient is in for annual physical exam. No other health concerns are expressed or addressed at the visit. Recent labs, if available are reviewed. Immunization is reviewed , and  updated if needed.   PE: BP 110/72   Pulse 72   Resp 15   Ht 5\' 3"  (1.6 m)   Wt 113 lb (51.3 kg)   LMP 11/30/2014   SpO2 100%   BMI 20.02 kg/m  Pleasant  female, alert and oriented x 3, in no cardio-pulmonary distress. Afebrile. HEENT No facial trauma or asymetry. Sinuses non tender.  Extra occullar muscles intact, pupils equally reactive to light. External ears normal, tympanic membranes clear. Oropharynx moist, no exudate. Neck: supple, no adenopathy,JVD or thyromegaly.No bruits.  Chest: Clear to ascultation bilaterally.No crackles or wheezes. Non tender to palpation  Breast: No asymetry,no masses or lumps. No tenderness. No nipple discharge or inversion. No axillary or supraclavicular adenopathy  Cardiovascular system; Heart sounds normal,  S1 and  S2 ,no S3.  No murmur, or thrill. Apical beat not displaced Peripheral pulses normal.  Abdomen: Soft, non tender, no organomegaly or masses. No bruits. Bowel sounds normal. No guarding, tenderness or rebound.  Rectal:  Normal sphincter tone. No rectal mass. Guaiac negative stool.  GU: External genitalia normal female genitalia , normal female distribution of hair. No lesions. Urethral meatus normal in size, no  Prolapse, no lesions visibly  Present. Bladder non tender. Vagina pink and moist , with no visible lesions , discharge present . Adequate pelvic support no  cystocele or rectocele noted Cervix pink and appears healthy, no lesions or ulcerations noted, no discharge noted from os Uterus normal size, no adnexal masses, no cervical motion or adnexal tenderness.   Musculoskeletal exam: Full ROM of spine, hips , shoulders and knees. No deformity ,swelling or  crepitus noted. No muscle wasting or atrophy.   Neurologic: Cranial nerves 2 to 12 intact. Power, tone ,sensation and reflexes normal throughout. No disturbance in gait. No tremor.  Skin: Intact, no ulceration, erythema , scaling or rash noted. Pigmentation normal throughout  Psych; Normal mood and affect. Judgement and concentration normal   Assessment & Plan:  Annual physical exam Annual exam as documented.  Immunization and cancer screening needs are specifically addressed at this visit.   NICOTINE ADDICTION Patient counseled for approximately 5 minutes regarding the health risks of ongoing nicotine use, specifically all types of cancer, heart disease, stroke and respiratory failure. The options available for help with cessation ,the behavioral changes to assist the process, and the option to either gradully reduce usage  Or abruptly stop.is also discussed. Pt is also encouraged to set specific goals in number of cigarettes used daily, as well as to set a quit date.     Need for influenza vaccination After obtaining informed consent, the vaccine is  administered by LPN.

## 2017-01-21 NOTE — Addendum Note (Signed)
Addended by: Eual Fines on: 01/21/2017 11:17 AM   Modules accepted: Orders

## 2017-01-21 NOTE — Assessment & Plan Note (Signed)

## 2017-01-21 NOTE — Assessment & Plan Note (Signed)
After obtaining informed consent, the vaccine is  administered by LPN.  

## 2017-01-21 NOTE — Assessment & Plan Note (Signed)
Annual exam as documented. . Immunization and cancer screening needs are specifically addressed at this visit.  

## 2017-01-21 NOTE — Patient Instructions (Addendum)
F/u mid October, call if you need me before  Fasting CBC, lipid, cmp, TSH Oct 8 or after, call in September for order  NEED to [place], call [place]  Thank you  for choosing [facility]. We consider it a privelige to serve you.  Delivering excellent health care in a caring and  compassionate way is our goal.  Partnering with you,  so that together we can achieve this goal is our strategy.

## 2017-01-23 ENCOUNTER — Telehealth: Payer: Self-pay | Admitting: Family Medicine

## 2017-01-23 LAB — CYTOLOGY - PAP
DIAGNOSIS: NEGATIVE
HPV (WINDOPATH): NOT DETECTED

## 2017-01-23 NOTE — Telephone Encounter (Signed)
Pls let pt know that her pap is normal, mail letter, thanks

## 2017-01-24 NOTE — Telephone Encounter (Signed)
Letter sent.

## 2017-02-26 ENCOUNTER — Other Ambulatory Visit: Payer: Self-pay | Admitting: Family Medicine

## 2017-02-26 ENCOUNTER — Telehealth: Payer: Self-pay | Admitting: Family Medicine

## 2017-02-26 MED ORDER — PREDNISONE 5 MG PO TABS: 5.0000 mg | ORAL_TABLET | Freq: Two times a day (BID) | ORAL | 0 refills | Status: AC

## 2017-02-26 NOTE — Telephone Encounter (Signed)
Carrie Perez is c/o sinuses draining down her throat and and in her ear, she is using nasal spray and allergy medication and she is asking for prednisone to keep her from getting a chest infection, please advise?

## 2017-02-26 NOTE — Telephone Encounter (Signed)
Med sent and pt is aware

## 2017-07-26 ENCOUNTER — Other Ambulatory Visit: Payer: Self-pay | Admitting: Family Medicine

## 2017-07-28 NOTE — Telephone Encounter (Signed)
Seen 01/21/17.

## 2017-08-26 ENCOUNTER — Ambulatory Visit: Payer: BLUE CROSS/BLUE SHIELD | Admitting: Family Medicine

## 2017-10-02 ENCOUNTER — Telehealth: Payer: Self-pay

## 2017-10-02 NOTE — Telephone Encounter (Signed)
Red right swollen eye x 2 days, wakes up and its crusted over but no reports of impaired vision. Some watering of the eye as well.Wants something sent to [facility] for it. Please advise

## 2017-10-03 ENCOUNTER — Encounter: Payer: Self-pay | Admitting: Family Medicine

## 2017-10-03 ENCOUNTER — Ambulatory Visit (INDEPENDENT_AMBULATORY_CARE_PROVIDER_SITE_OTHER): Payer: Self-pay | Admitting: Family Medicine

## 2017-10-03 ENCOUNTER — Telehealth: Payer: Self-pay | Admitting: Family Medicine

## 2017-10-03 ENCOUNTER — Other Ambulatory Visit: Payer: Self-pay

## 2017-10-03 VITALS — BP 110/76 | HR 76 | Temp 98.5°F | Resp 16 | Ht 63.0 in | Wt 110.0 lb

## 2017-10-03 DIAGNOSIS — H1031 Unspecified acute conjunctivitis, right eye: Secondary | ICD-10-CM

## 2017-10-03 MED ORDER — TOBRAMYCIN 0.3 % OP SOLN: 2.0000 [drp] | OPHTHALMIC | 0 refills | Status: DC

## 2017-10-03 MED ORDER — OFLOXACIN 0.3 % OP SOLN: OPHTHALMIC | 0 refills | Status: DC

## 2017-10-03 NOTE — Telephone Encounter (Signed)
appt scheduled today 
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
## 2017-10-03 NOTE — Addendum Note (Signed)
Addended by: Caren Macadam on: 10/03/2017 03:16 PM   Modules accepted: Orders

## 2017-10-03 NOTE — Telephone Encounter (Signed)
Patient has called 2x this morning, states she called 6 other times without leaving a voicemail (we had a meeting) she says she came in yesterday (seen a msg that patient came at 4:57) requesting a medication. I let her know 3 minutes before closing is not enough time for a physician to approve a medication/send to pharmacy and that this is normally a 24hr process (per office policy) she says she needs this medication. I told her I will call her when I receive an update but that Dr.Simpson is out of the office today, is it possible for another physician to approve this ?

## 2017-10-03 NOTE — Telephone Encounter (Signed)
Patient called in because she says she was told by Dr.Hagler that her eye drops wouldn't be any more than $10 but when she got to the pharmacy they were $200 She said she is on her way back to the office. I let her know Dr.Hagler was in clinic and we would call her.

## 2017-10-04 NOTE — Telephone Encounter (Signed)
Pt evaluated in the office on 10/03/2017

## 2017-10-06 ENCOUNTER — Ambulatory Visit: Payer: BLUE CROSS/BLUE SHIELD | Admitting: Family Medicine

## 2017-12-09 ENCOUNTER — Telehealth: Payer: Self-pay

## 2017-12-09 ENCOUNTER — Telehealth: Payer: Self-pay | Admitting: Family Medicine

## 2017-12-09 DIAGNOSIS — E785 Hyperlipidemia, unspecified: Secondary | ICD-10-CM

## 2017-12-09 DIAGNOSIS — Z23 Encounter for immunization: Secondary | ICD-10-CM

## 2017-12-09 NOTE — Telephone Encounter (Signed)
labwork ordered and needs to have done fasting

## 2017-12-09 NOTE — Telephone Encounter (Signed)
Labs ordered.

## 2017-12-09 NOTE — Telephone Encounter (Signed)
Please call the pt her energy level is low, and she needs to know what to do [phone]

## 2017-12-10 NOTE — Telephone Encounter (Signed)
Patient aware.

## 2017-12-11 DIAGNOSIS — R7989 Other specified abnormal findings of blood chemistry: Secondary | ICD-10-CM | POA: Diagnosis not present

## 2017-12-11 DIAGNOSIS — E785 Hyperlipidemia, unspecified: Secondary | ICD-10-CM | POA: Diagnosis not present

## 2017-12-12 LAB — CBC
HEMATOCRIT: 37.9 % (ref 35.0–45.0)
Hemoglobin: 12.6 g/dL (ref 11.7–15.5)
MCH: 29.2 pg (ref 27.0–33.0)
MCHC: 33.2 g/dL (ref 32.0–36.0)
MCV: 87.9 fL (ref 80.0–100.0)
MPV: 10.2 fL (ref 7.5–12.5)
Platelets: 230 10*3/uL (ref 140–400)
RBC: 4.31 10*6/uL (ref 3.80–5.10)
RDW: 13 % (ref 11.0–15.0)
WBC: 4.1 10*3/uL (ref 3.8–10.8)

## 2017-12-12 LAB — COMPLETE METABOLIC PANEL WITH GFR
AG Ratio: 1.4 (calc) (ref 1.0–2.5)
ALKALINE PHOSPHATASE (APISO): 75 U/L (ref 33–130)
ALT: 7 U/L (ref 6–29)
AST: 15 U/L (ref 10–35)
Albumin: 4.4 g/dL (ref 3.6–5.1)
BUN/Creatinine Ratio: 31 (calc) — ABNORMAL HIGH (ref 6–22)
BUN: 32 mg/dL — ABNORMAL HIGH (ref 7–25)
CHLORIDE: 101 mmol/L (ref 98–110)
CO2: 30 mmol/L (ref 20–32)
Calcium: 10.2 mg/dL (ref 8.6–10.4)
Creat: 1.02 mg/dL (ref 0.50–1.05)
GFR, Est African American: 72 mL/min/{1.73_m2} (ref >=60)
GFR, Est Non African American: 62 mL/min/{1.73_m2} (ref >=60)
GLOBULIN: 3.1 g/dL (ref 1.9–3.7)
Glucose, Bld: 99 mg/dL (ref 65–99)
Potassium: 4.1 mmol/L (ref 3.5–5.3)
SODIUM: 139 mmol/L (ref 135–146)
Total Bilirubin: 0.4 mg/dL (ref 0.2–1.2)
Total Protein: 7.5 g/dL (ref 6.1–8.1)

## 2017-12-12 LAB — TSH: TSH: 7.58 mIU/L — ABNORMAL HIGH

## 2017-12-12 LAB — LIPID PANEL
CHOLESTEROL: 242 mg/dL — AB (ref <200)
HDL: 62 mg/dL (ref >50)
LDL Cholesterol (Calc): 155 mg/dL (calc) — ABNORMAL HIGH
Non-HDL Cholesterol (Calc): 180 mg/dL (calc) — ABNORMAL HIGH (ref <130)
Total CHOL/HDL Ratio: 3.9 (calc) (ref <5.0)
Triglycerides: 124 mg/dL (ref <150)

## 2017-12-12 LAB — T3, FREE: T3, Free: 2.7 pg/mL (ref 2.3–4.2)

## 2017-12-12 LAB — T4, FREE: FREE T4: 0.9 ng/dL (ref 0.8–1.8)

## 2017-12-24 ENCOUNTER — Telehealth: Payer: Self-pay | Admitting: Family Medicine

## 2017-12-24 NOTE — Telephone Encounter (Signed)
Back is flaring up again, would like another RX for Muscle relaxer.

## 2017-12-25 ENCOUNTER — Telehealth: Payer: Self-pay | Admitting: Family Medicine

## 2017-12-25 ENCOUNTER — Other Ambulatory Visit: Payer: Self-pay

## 2017-12-25 NOTE — Telephone Encounter (Signed)
The last message was asking for a muscle relaxer but today she is saying its not pain but a hotness in her chest, stomach and back and its not menopause. Please advise

## 2017-12-25 NOTE — Telephone Encounter (Signed)
Cb# [phone]  Patient states she feels hot in her chest, her stomach, and upper back. She states "she cant sleep or go to work because of this hotness on the inside"

## 2017-12-25 NOTE — Telephone Encounter (Signed)
Noted, then I also note the new message

## 2017-12-26 ENCOUNTER — Other Ambulatory Visit: Payer: Self-pay | Admitting: Family Medicine

## 2017-12-26 DIAGNOSIS — R7989 Other specified abnormal findings of blood chemistry: Secondary | ICD-10-CM

## 2017-12-26 NOTE — Telephone Encounter (Signed)
Patient aware.

## 2017-12-26 NOTE — Telephone Encounter (Signed)
I called pt this morning trying to understand her symptoms, I cannot treat her based on what she is reporting, she may try OTC  zantac  To se if her symptoms are GI related, HOWEVER they may be related to her heart so I suggest  She seek medical evaluation  in an urgent care setting , I will have no appointments available next week . Also please let her know that her recent labs showed that her thyroid gland MAY be underactive, so I am going to order an ultrasound of her gland and also to refer her to DrNida , the thyroid specialist for evaluation of the thyrpoid g;land, both  Have been enntered

## 2017-12-27 ENCOUNTER — Emergency Department (HOSPITAL_COMMUNITY): Payer: BLUE CROSS/BLUE SHIELD

## 2017-12-27 ENCOUNTER — Other Ambulatory Visit: Payer: Self-pay

## 2017-12-27 ENCOUNTER — Emergency Department (HOSPITAL_COMMUNITY)
Admission: EM | Admit: 2017-12-27 | Discharge: 2017-12-27 | Disposition: A | Discharge status: Home / Self Care | Payer: BLUE CROSS/BLUE SHIELD | Attending: Emergency Medicine | Admitting: Emergency Medicine

## 2017-12-27 ENCOUNTER — Encounter (HOSPITAL_COMMUNITY): Payer: Self-pay | Admitting: *Deleted

## 2017-12-27 DIAGNOSIS — F17210 Nicotine dependence, cigarettes, uncomplicated: Secondary | ICD-10-CM | POA: Insufficient documentation

## 2017-12-27 DIAGNOSIS — R0789 Other chest pain: Secondary | ICD-10-CM | POA: Diagnosis present

## 2017-12-27 DIAGNOSIS — R1013 Epigastric pain: Secondary | ICD-10-CM

## 2017-12-27 HISTORY — DX: Other chronic pain: G89.29

## 2017-12-27 HISTORY — DX: Dorsalgia, unspecified: M54.9

## 2017-12-27 LAB — LIPASE, BLOOD: LIPASE: 29 U/L (ref 11–51)

## 2017-12-27 LAB — CBC
HEMATOCRIT: 41.9 % (ref 36.0–46.0)
Hemoglobin: 13.7 g/dL (ref 12.0–15.0)
MCH: 29.1 pg (ref 26.0–34.0)
MCHC: 32.7 g/dL (ref 30.0–36.0)
MCV: 89.1 fL (ref 78.0–100.0)
Platelets: 235 10*3/uL (ref 150–400)
RBC: 4.7 MIL/uL (ref 3.87–5.11)
RDW: 13.9 % (ref 11.5–15.5)
WBC: 5.3 10*3/uL (ref 4.0–10.5)

## 2017-12-27 LAB — URINALYSIS, ROUTINE W REFLEX MICROSCOPIC
Bilirubin Urine: NEGATIVE
Glucose, UA: NEGATIVE mg/dL
Hgb urine dipstick: NEGATIVE
KETONES UR: NEGATIVE mg/dL
LEUKOCYTES UA: NEGATIVE
NITRITE: NEGATIVE
PH: 7 (ref 5.0–8.0)
Protein, ur: NEGATIVE mg/dL
SPECIFIC GRAVITY, URINE: 1.004 — AB (ref 1.005–1.030)

## 2017-12-27 LAB — BASIC METABOLIC PANEL
Anion gap: 12 (ref 5–15)
BUN: 18 mg/dL (ref 6–20)
CHLORIDE: 99 mmol/L — AB (ref 101–111)
CO2: 28 mmol/L (ref 22–32)
Calcium: 9.9 mg/dL (ref 8.9–10.3)
Creatinine, Ser: 0.81 mg/dL (ref 0.44–1.00)
GFR calc non Af Amer: >60 mL/min (ref >60)
Glucose, Bld: 91 mg/dL (ref 65–99)
POTASSIUM: 3.4 mmol/L — AB (ref 3.5–5.1)
Sodium: 139 mmol/L (ref 135–145)

## 2017-12-27 LAB — HEPATIC FUNCTION PANEL
ALT: 12 U/L — ABNORMAL LOW (ref 14–54)
AST: 24 U/L (ref 15–41)
Albumin: 4.3 g/dL (ref 3.5–5.0)
Alkaline Phosphatase: 75 U/L (ref 38–126)
Bilirubin, Direct: 0.3 mg/dL (ref 0.1–0.5)
Indirect Bilirubin: 0.7 mg/dL (ref 0.3–0.9)
Total Bilirubin: 1 mg/dL (ref 0.3–1.2)
Total Protein: 8 g/dL (ref 6.5–8.1)

## 2017-12-27 LAB — I-STAT TROPONIN, ED
Troponin i, poc: 0 ng/mL (ref 0.00–0.08)
Troponin i, poc: 0 ng/mL (ref 0.00–0.08)

## 2017-12-27 LAB — I-STAT BETA HCG BLOOD, ED (MC, WL, AP ONLY)

## 2017-12-27 MED ORDER — KETOROLAC TROMETHAMINE 30 MG/ML IJ SOLN
15.0000 mg | Freq: Once | INTRAMUSCULAR | Status: AC
  Administered 2017-12-27: 15 mg via INTRAVENOUS
  Filled 2017-12-27: qty 1

## 2017-12-27 MED ORDER — FAMOTIDINE 20 MG PO TABS: 20.0000 mg | ORAL_TABLET | Freq: Two times a day (BID) | ORAL | 0 refills | Status: DC

## 2017-12-27 MED ORDER — GI COCKTAIL ~~LOC~~
30.0000 mL | Freq: Once | ORAL | Status: AC
  Administered 2017-12-27: 30 mL via ORAL
  Filled 2017-12-27: qty 30

## 2017-12-27 MED ORDER — FAMOTIDINE IN NACL 20-0.9 MG/50ML-% IV SOLN
20.0000 mg | Freq: Once | INTRAVENOUS | Status: AC
  Administered 2017-12-27: 20 mg via INTRAVENOUS
  Filled 2017-12-27: qty 50

## 2017-12-27 NOTE — ED Notes (Signed)
Patient is upset about wait time. Writer explained to patient the provider will be in to see her as soon as she is available. Apologized for wait. Patient did not respond to Probation officer after speaking to patient.

## 2017-12-27 NOTE — ED Triage Notes (Signed)
Pt c/o mid chest pain x 2 weeks; pt states she woke up to a burning feeling in her chest around 615 this am

## 2017-12-27 NOTE — ED Notes (Signed)
Patient stated she feels better and was able to keep medication down. Patient stated to writer she did not want anything else to drink.
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
## 2017-12-27 NOTE — Discharge Instructions (Signed)
Eat a bland diet, avoiding greasy, fatty, fried foods, as well as spicy and acidic foods or beverages.  Avoid eating within 2 to 3 hours before going to bed or laying down.  Also avoid teas, colas, coffee, chocolate, pepermint and spearment.  May also take over the counter maalox/mylanta, as directed on packaging, as needed for discomfort.  Take the prescription as directed.  Call your regular medical doctor on Monday to schedule a follow up appointment in the next 3 days. Call the GI doctor on Monday to schedule a follow up appointment within the next week.  Return to the Emergency Department immediately if worsening.

## 2017-12-27 NOTE — ED Notes (Signed)
To US at this time

## 2017-12-27 NOTE — ED Notes (Signed)
40 depature charted on wrong patient.

## 2018-01-07 ENCOUNTER — Telehealth: Payer: Self-pay | Admitting: Family Medicine

## 2018-01-07 NOTE — Telephone Encounter (Signed)
Pt is calling needs to talk to someone--she has no energy , please call her when you get a chance

## 2018-01-07 NOTE — Telephone Encounter (Signed)
Already advised a multivitamin. Nothing else I can do. Needs appt

## 2018-01-09 ENCOUNTER — Ambulatory Visit (HOSPITAL_COMMUNITY): Admission: RE | Admit: 2018-01-09 | Payer: BLUE CROSS/BLUE SHIELD | Source: Ambulatory Visit

## 2018-01-12 ENCOUNTER — Ambulatory Visit (HOSPITAL_COMMUNITY)
Admission: RE | Admit: 2018-01-12 | Discharge: 2018-01-12 | Disposition: A | Discharge status: Home / Self Care | Payer: BLUE CROSS/BLUE SHIELD | Source: Ambulatory Visit | Attending: Family Medicine | Admitting: Family Medicine

## 2018-01-12 DIAGNOSIS — R7989 Other specified abnormal findings of blood chemistry: Secondary | ICD-10-CM

## 2018-01-12 DIAGNOSIS — R946 Abnormal results of thyroid function studies: Secondary | ICD-10-CM | POA: Diagnosis not present

## 2018-01-14 ENCOUNTER — Telehealth: Payer: Self-pay | Admitting: Family Medicine

## 2018-01-14 ENCOUNTER — Other Ambulatory Visit: Payer: Self-pay | Admitting: Family Medicine

## 2018-01-14 ENCOUNTER — Encounter: Payer: Self-pay | Admitting: "Endocrinology

## 2018-01-14 ENCOUNTER — Ambulatory Visit (INDEPENDENT_AMBULATORY_CARE_PROVIDER_SITE_OTHER): Payer: BLUE CROSS/BLUE SHIELD | Admitting: "Endocrinology

## 2018-01-14 VITALS — BP 115/78 | HR 65 | Ht 63.0 in | Wt 110.0 lb

## 2018-01-14 DIAGNOSIS — F17200 Nicotine dependence, unspecified, uncomplicated: Secondary | ICD-10-CM

## 2018-01-14 DIAGNOSIS — E039 Hypothyroidism, unspecified: Secondary | ICD-10-CM | POA: Insufficient documentation

## 2018-01-14 MED ORDER — LEVOTHYROXINE SODIUM 25 MCG PO TABS: 25.0000 ug | ORAL_TABLET | Freq: Every day | ORAL | 2 refills | Status: DC

## 2018-01-14 MED ORDER — MIRTAZAPINE 7.5 MG PO TABS: 7.5000 mg | ORAL_TABLET | Freq: Every day | ORAL | 1 refills | Status: DC

## 2018-01-14 NOTE — Telephone Encounter (Signed)
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 Dr Laverta Baltimore put her on levothyroxine.  Pt states that she has called with no response, she is trying to keep composure but is very upset, I advised that Dr Moshe Cipro was in Clinic, and Velna Hatchet was unavailable. Pt Was taken Gabapentin and  Alexapro (anti)  The PT states that she took those in the past and it helped, Was wondering if she can start taking these again.Marland Kitchen Please call Mrs Robben.. # [phone]
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
## 2018-01-14 NOTE — Telephone Encounter (Signed)
I spoke with the pt and have prescribed medication Please schedule an appointment for her to be seen in April , mid to end. She is awarwe that her ,mammogram is past due and has agreed to schedule this and to have this done also

## 2018-01-16 NOTE — Telephone Encounter (Signed)
Spoke to pt she needs a 8am appt since she works 3rds shift--please advise if she can be seen somewhere--I dont show any open slots

## 2018-01-20 ENCOUNTER — Ambulatory Visit: Payer: Self-pay | Admitting: "Endocrinology

## 2018-01-20 NOTE — Telephone Encounter (Signed)
If none, ok to double book one am without 10 pts already

## 2018-01-21 NOTE — Telephone Encounter (Signed)
sch 4-24 @ 8

## 2018-01-28 ENCOUNTER — Telehealth: Payer: Self-pay | Admitting: Family Medicine

## 2018-01-28 ENCOUNTER — Telehealth: Payer: Self-pay | Admitting: "Endocrinology

## 2018-01-28 NOTE — Telephone Encounter (Signed)
Carrie Perez is complaining that the levothyroxine (SYNTHROID, LEVOTHROID) 25 MCG tablet is making her feel horrible, making her nauseated loss of appetite, please advise?

## 2018-01-28 NOTE — Telephone Encounter (Signed)
She can break it in half and take 1/2 pill daily. This medication is not known to suppress appetite.

## 2018-01-28 NOTE — Telephone Encounter (Signed)
Patient is calling in to let you know she is having all the size effects of mirtazapine (nausea, lack of energy, no appetite)  Cb#: [phone]

## 2018-01-29 ENCOUNTER — Ambulatory Visit: Payer: BLUE CROSS/BLUE SHIELD | Admitting: Family Medicine

## 2018-01-29 ENCOUNTER — Encounter: Payer: Self-pay | Admitting: Family Medicine

## 2018-01-29 VITALS — BP 102/74 | HR 70 | Resp 16 | Ht 63.0 in | Wt 108.0 lb

## 2018-01-29 DIAGNOSIS — L309 Dermatitis, unspecified: Secondary | ICD-10-CM | POA: Diagnosis not present

## 2018-01-29 DIAGNOSIS — K219 Gastro-esophageal reflux disease without esophagitis: Secondary | ICD-10-CM | POA: Diagnosis not present

## 2018-01-29 DIAGNOSIS — F17200 Nicotine dependence, unspecified, uncomplicated: Secondary | ICD-10-CM | POA: Diagnosis not present

## 2018-01-29 DIAGNOSIS — R630 Anorexia: Secondary | ICD-10-CM

## 2018-01-29 DIAGNOSIS — J309 Allergic rhinitis, unspecified: Secondary | ICD-10-CM

## 2018-01-29 MED ORDER — PANTOPRAZOLE SODIUM 40 MG PO TBEC: 40.0000 mg | DELAYED_RELEASE_TABLET | Freq: Every day | ORAL | 0 refills | Status: DC

## 2018-01-29 MED ORDER — MEGESTROL ACETATE 40 MG PO TABS: 40.0000 mg | ORAL_TABLET | Freq: Every day | ORAL | 1 refills | Status: DC

## 2018-01-29 NOTE — Telephone Encounter (Signed)
Pt.notified

## 2018-01-29 NOTE — Telephone Encounter (Signed)
Scheduled to come in today to discuss her complaints

## 2018-01-29 NOTE — Patient Instructions (Addendum)
F/u as before, make sure that you keep you appointment  New is medication for reflux and for appetite  Please commit to stopping smoking to feel better  Depo medrol administered in office for uncontrolled allergy symptoms

## 2018-01-30 ENCOUNTER — Encounter: Payer: Self-pay | Admitting: Family Medicine

## 2018-01-30 DIAGNOSIS — J309 Allergic rhinitis, unspecified: Secondary | ICD-10-CM | POA: Diagnosis not present

## 2018-01-30 MED ORDER — METHYLPREDNISOLONE ACETATE 80 MG/ML IJ SUSP
80.0000 mg | Freq: Once | INTRAMUSCULAR | Status: AC
  Administered 2018-01-30: 80 mg via INTRAMUSCULAR

## 2018-02-02 ENCOUNTER — Encounter: Payer: Self-pay | Admitting: Family Medicine

## 2018-02-02 DIAGNOSIS — L239 Allergic contact dermatitis, unspecified cause: Secondary | ICD-10-CM | POA: Insufficient documentation

## 2018-02-02 DIAGNOSIS — K219 Gastro-esophageal reflux disease without esophagitis: Secondary | ICD-10-CM | POA: Insufficient documentation

## 2018-02-02 DIAGNOSIS — L309 Dermatitis, unspecified: Secondary | ICD-10-CM | POA: Insufficient documentation

## 2018-02-02 NOTE — Assessment & Plan Note (Signed)
Symptoms c/w reflux described and uncontrolled trial of PPI for symptom control

## 2018-02-02 NOTE — Assessment & Plan Note (Signed)
Asked: confirms current cigarette smoking, less than 5 /day Assess: unwilling to set quit date currently Advise: Need to quit to reduce all cancers , heart disease and stroke, also will likely help with improving appetite and weight gain which she desires Arrange f/u in 4 to 6 weeks Pt reminded of [place], also community resource of classes for women at [facility] which are free and open to all Time spent 3 minutes

## 2018-02-02 NOTE — Progress Notes (Signed)
   Carrie Perez     MRN: [id number]      DOB: November 18, 1961   HPI Carrie Perez is here for follow up and re-evaluation of chronic medical conditions, medication management and review of any available recent lab and radiology data.  Very anxious and distressed as she has , since January lost her appetite,which is  unlike her, she has been losing weight, had labs which showed underactive thyroid and been evaluated and started on medication for this, all without visit with PCP and she is confused and overwhelmed States she has no energy and just wants medication to help with this and her appetite C/o increased allergy symptoms, nasal congestion , clear runny nose , sneezing , expected at change of season, requests allergy shot. C/o burning  In epigastrium and subdermally, esp with bending over and lying down, denies change in BM but colonoscopy is past due C/o rash on left jaw ,near to earring , itches , wants treatment for this Mammogram needs to be scheduled  ROS See HPI  Denies recent fever or chills.  Denies chest congestion, productive cough or wheezing. Denies chest pains, palpitations and leg swelling Denies abdominal pain, nausea, vomiting,diarrhea or constipation.   Denies dysuria, frequency, hesitancy or incontinence. Denies joint pain, swelling and limitation in mobility.  PE  BP 102/74   Pulse 70   Resp 16   Ht 5\' 3"  (1.6 m)   Wt 108 lb (49 kg)   LMP 11/30/2014   SpO2 98%   BMI 19.13 kg/m   Patient alert and oriented and in no cardiopulmonary distress. Anxious and at times tearful  HEENT: No facial asymmetry, EOMI,   oropharynx pink and moist.  Neck supple no JVD, no mass.Erythema and edema of nasal mucosa, TM clear Chest: Clear to auscultation bilaterally.  CVS: S1, S2 no murmurs, no S3.Regular rate.  ABD: Soft mil epigastric tenderness, no guarding or rebound.   Ext: No edema  MS: Adequate ROM spine, shoulders, hips and knees.  Skin: Intact, no ulcerations   Noted.Hyperpigmented macular rash noted diameter approx 2.5 cm in left mandibular region  Psych: Good eye contact, normal affect. Memory intact  anxious not  depressed appearing.  CNS: CN 2-12 intact, power,  normal throughout.no focal deficits noted.   Assessment & Plan  Allergic rhinitis Uncontrolled Depo medrol 80 mg Im administered at visit  Current smoker Asked: confirms current cigarette smoking, less than 5 /day Assess: unwilling to set quit date currently Advise: Need to quit to reduce all cancers , heart disease and stroke, also will likely help with improving appetite and weight gain which she desires Arrange f/u in 4 to 6 weeks Pt reminded of [place], also community resource of classes for women at HD which are free and open to all Time spent 3 minutes  Poor appetite Pt concerned about ongoing lack of appetite  With failure to gain weight. Low dose megace prescribed for 2 months total with close follow up  GERD (gastroesophageal reflux disease) Symptoms c/w reflux described and uncontrolled trial of PPI for symptom control   Dermatitis Topical low dose steroid , hydrocortisone twice daily for 5 days , then as needed, pt has this at home

## 2018-02-02 NOTE — Assessment & Plan Note (Signed)
Pt concerned about ongoing lack of appetite  With failure to gain weight. Low dose megace prescribed for 2 months total with close follow up

## 2018-02-02 NOTE — Assessment & Plan Note (Signed)
Topical low dose steroid , hydrocortisone twice daily for 5 days , then as needed, pt has this at home

## 2018-02-02 NOTE — Assessment & Plan Note (Signed)
Uncontrolled Depo medrol 80 mg Im administered at visit

## 2018-02-09 ENCOUNTER — Other Ambulatory Visit: Payer: Self-pay | Admitting: Family Medicine

## 2018-02-09 IMAGING — MG DIGITAL SCREENING BILATERAL MAMMOGRAM WITH CAD
4 series · 4 of 4 positions shown · non-contrast
Comparison: Previous exam(s).

CLINICAL DATA: Screening.

EXAM:
DIGITAL SCREENING BILATERAL MAMMOGRAM WITH CAD

[R CC]
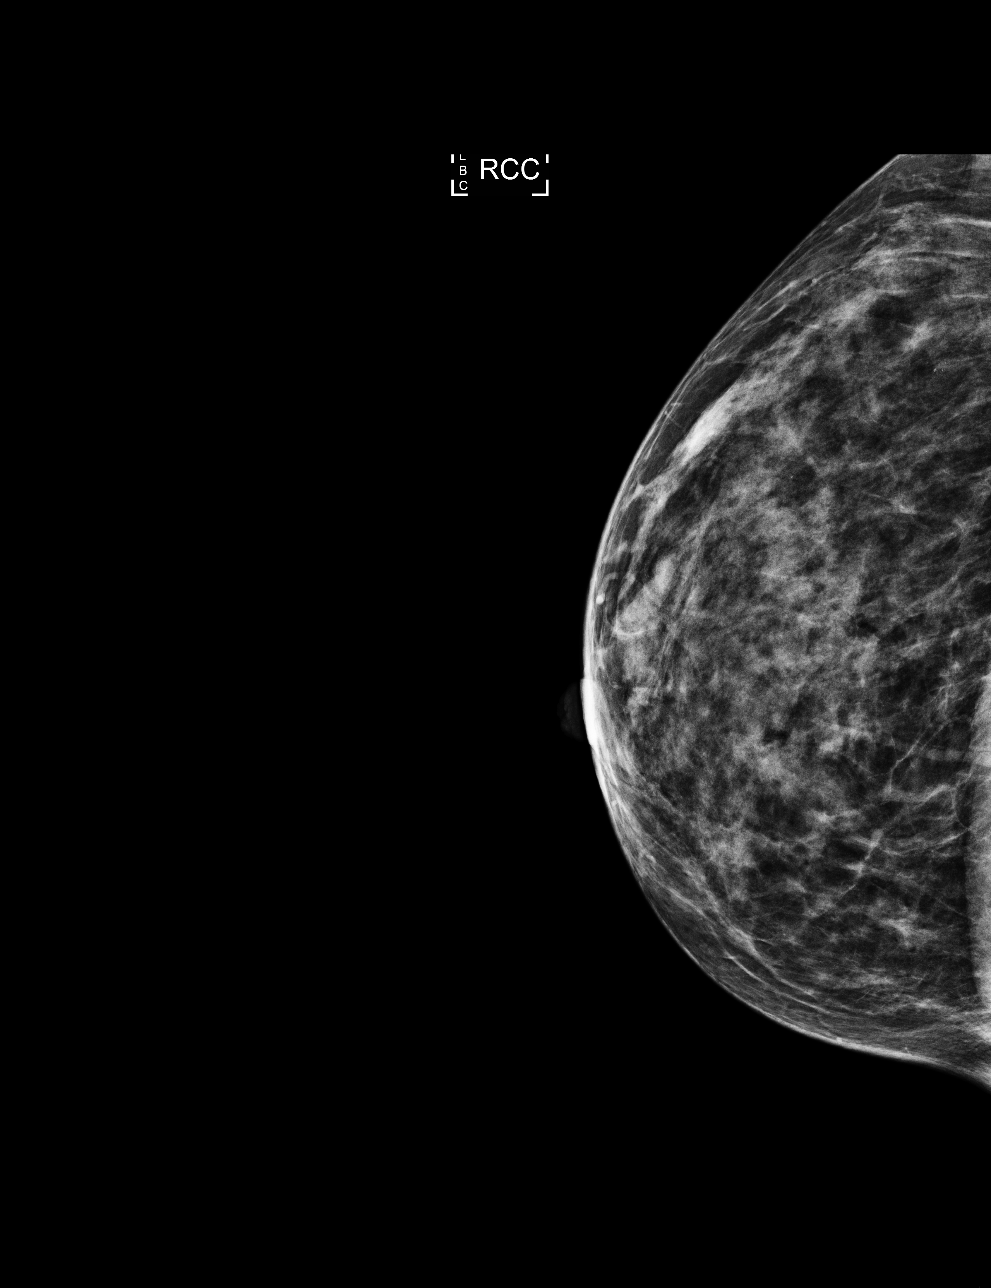

[R MLO]
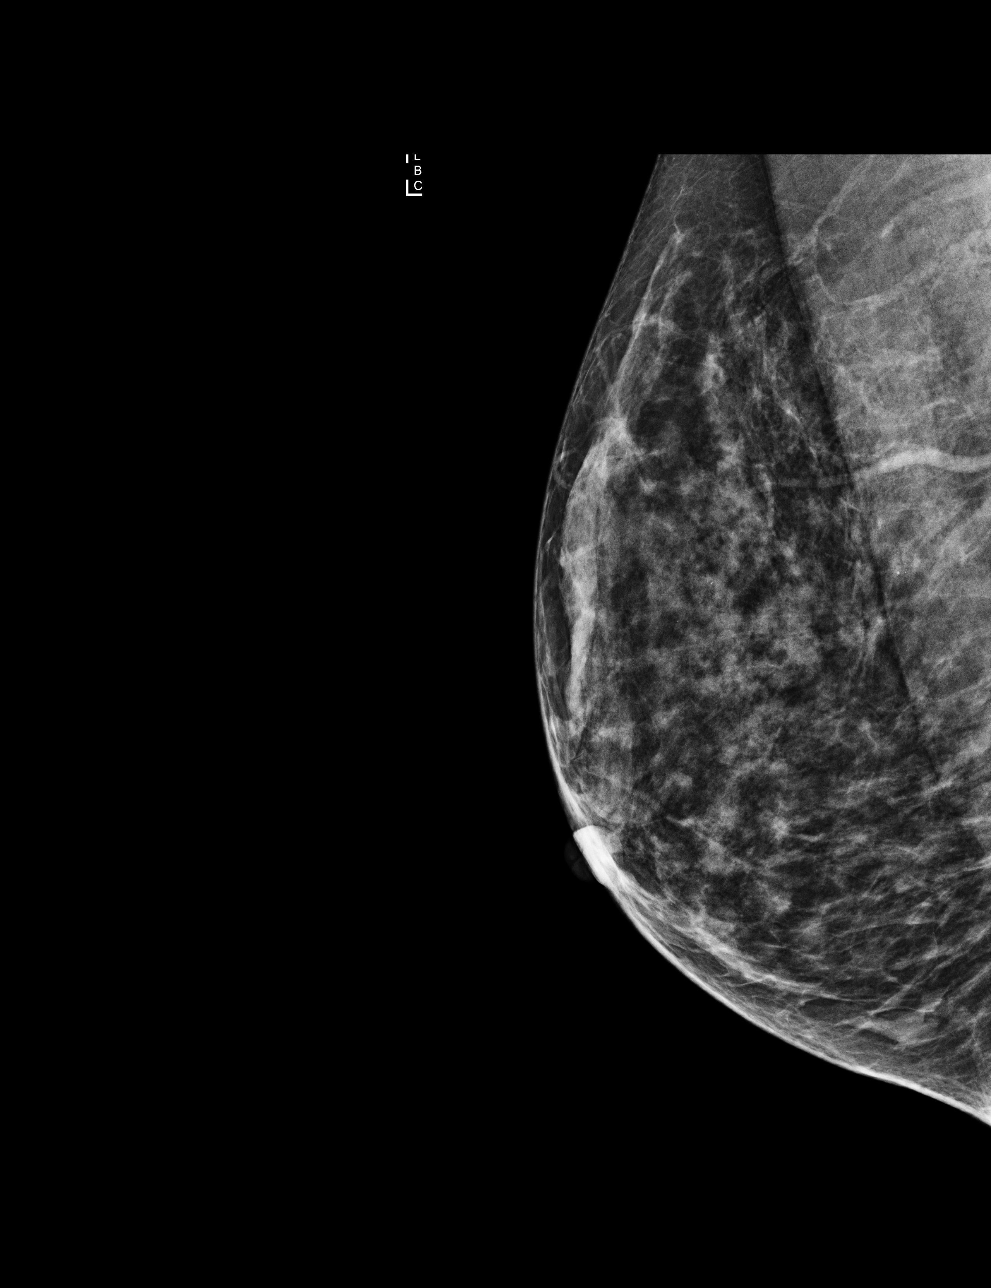

[L MLO]
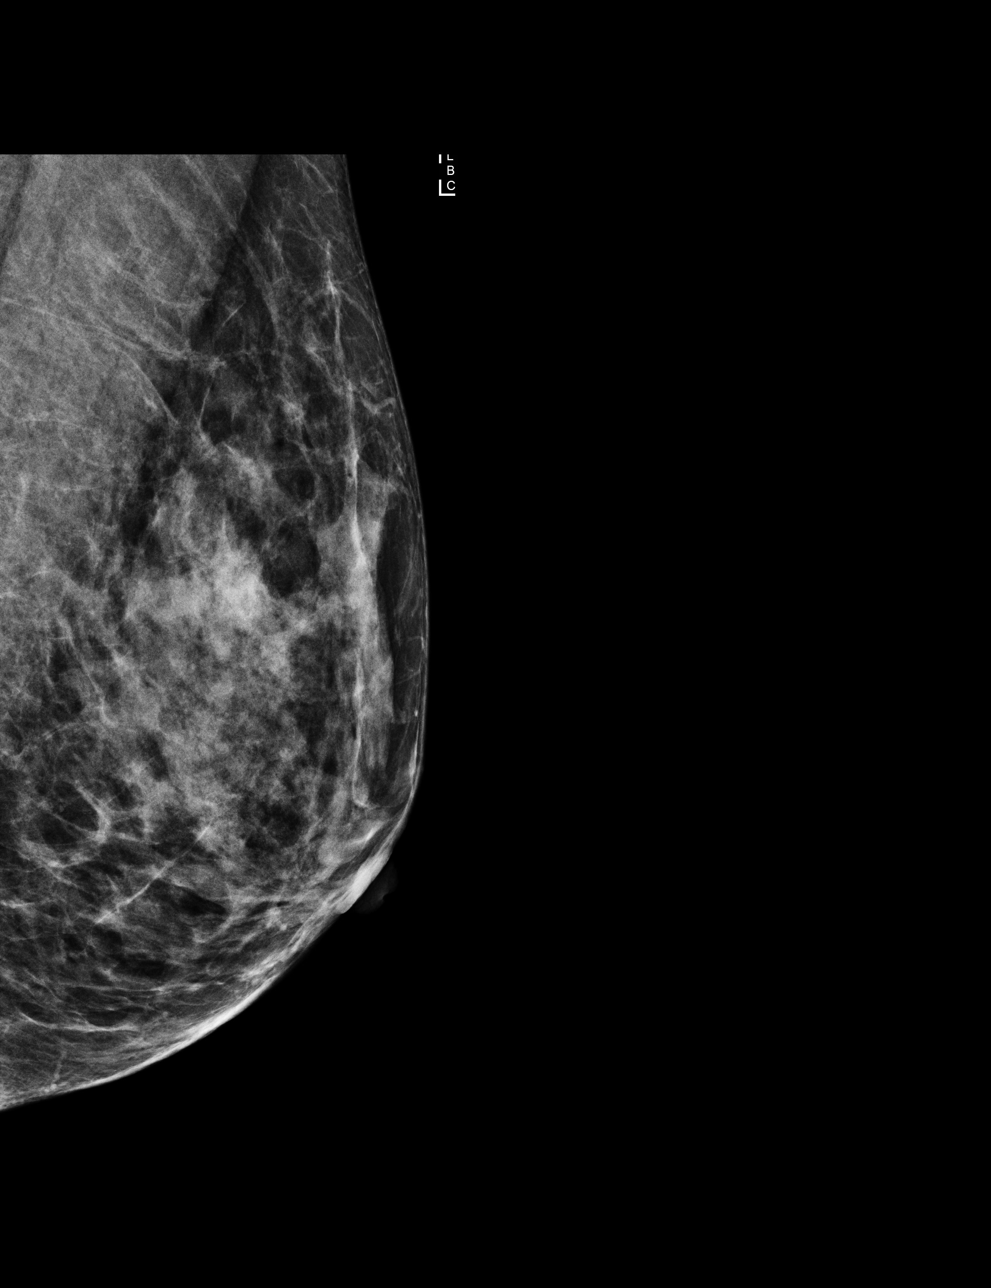

[L CC]
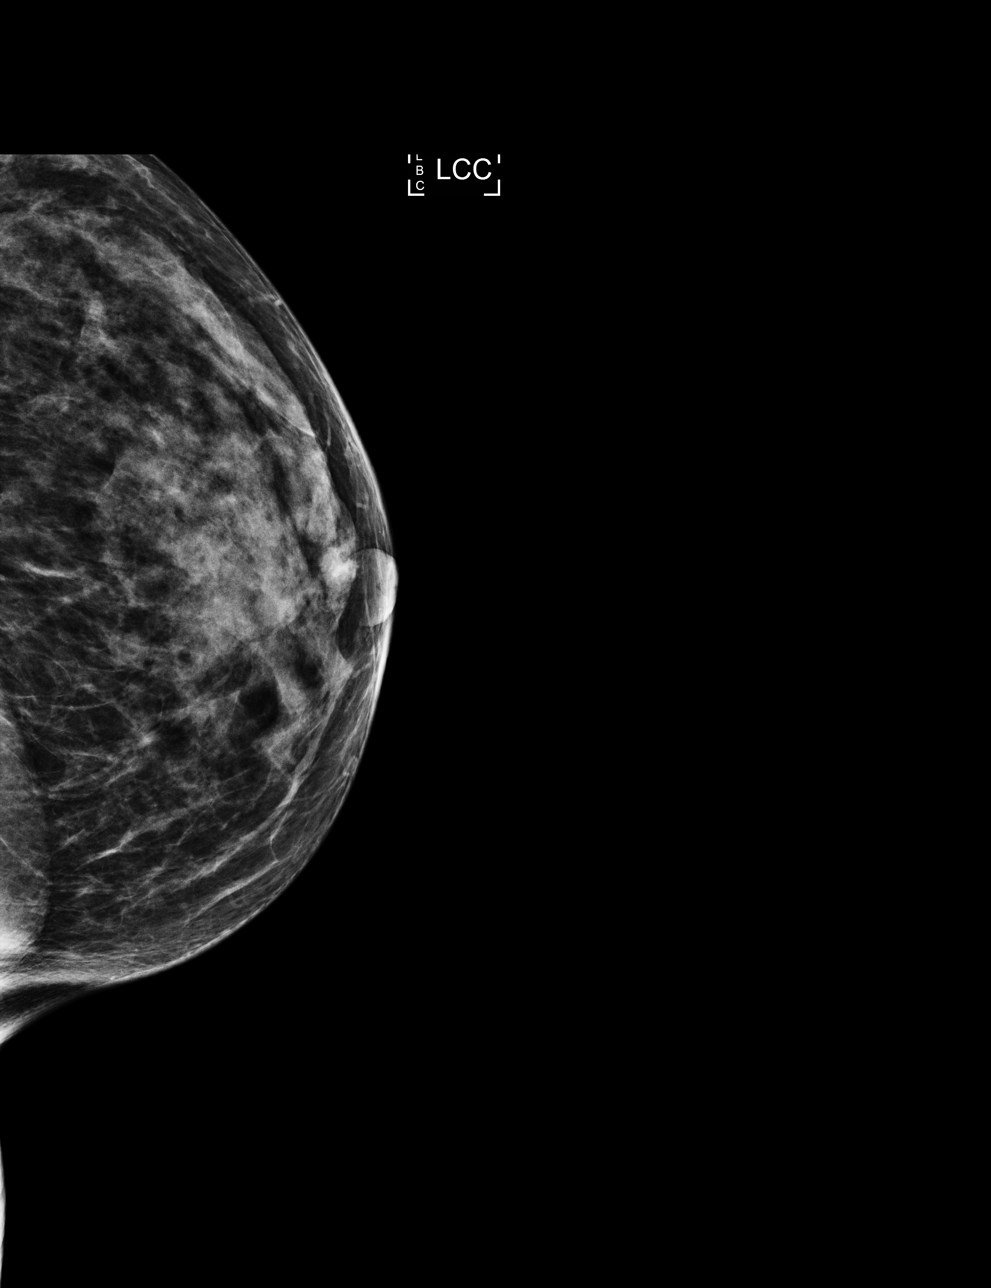

[4 of 4 positions shown; findings below may reference images not displayed]

ACR Breast Density Category c: The breast tissue is heterogeneously
dense, which may obscure small masses.
FINDINGS: In the left breast, a possible asymmetry warrants further
evaluation. In the right breast, no findings suspicious for
malignancy. Images were processed with CAD.
IMPRESSION: Further evaluation is suggested for possible asymmetry in the left
breast.

RECOMMENDATION:
Diagnostic mammogram and possibly ultrasound of the left breast.
(Code:[id number]-X-WW1)

The patient will be contacted regarding the findings, and additional
imaging will be scheduled.

BI-RADS CATEGORY  0: Incomplete. Need additional imaging evaluation
and/or prior mammograms for comparison.

## 2018-02-13 DIAGNOSIS — Z008 Encounter for other general examination: Secondary | ICD-10-CM | POA: Diagnosis not present

## 2018-03-03 ENCOUNTER — Other Ambulatory Visit: Payer: Self-pay | Admitting: Family Medicine

## 2018-03-03 DIAGNOSIS — Z139 Encounter for screening, unspecified: Secondary | ICD-10-CM

## 2018-03-03 DIAGNOSIS — Z1231 Encounter for screening mammogram for malignant neoplasm of breast: Secondary | ICD-10-CM

## 2018-03-04 ENCOUNTER — Ambulatory Visit (HOSPITAL_COMMUNITY)
Admission: RE | Admit: 2018-03-04 | Discharge: 2018-03-04 | Disposition: A | Discharge status: Home / Self Care | Payer: BLUE CROSS/BLUE SHIELD | Source: Ambulatory Visit | Attending: Family Medicine | Admitting: Family Medicine

## 2018-03-04 ENCOUNTER — Encounter (HOSPITAL_COMMUNITY): Payer: Self-pay

## 2018-03-04 DIAGNOSIS — Z1231 Encounter for screening mammogram for malignant neoplasm of breast: Secondary | ICD-10-CM | POA: Diagnosis not present

## 2018-03-11 ENCOUNTER — Encounter: Payer: Self-pay | Admitting: Family Medicine

## 2018-03-11 ENCOUNTER — Ambulatory Visit: Payer: BLUE CROSS/BLUE SHIELD | Admitting: Family Medicine

## 2018-03-11 VITALS — BP 100/72 | HR 71 | Resp 16 | Ht 63.0 in | Wt 111.0 lb

## 2018-03-11 DIAGNOSIS — J309 Allergic rhinitis, unspecified: Secondary | ICD-10-CM | POA: Diagnosis not present

## 2018-03-11 DIAGNOSIS — F17200 Nicotine dependence, unspecified, uncomplicated: Secondary | ICD-10-CM

## 2018-03-11 DIAGNOSIS — M62830 Muscle spasm of back: Secondary | ICD-10-CM | POA: Diagnosis not present

## 2018-03-11 DIAGNOSIS — K219 Gastro-esophageal reflux disease without esophagitis: Secondary | ICD-10-CM

## 2018-03-11 DIAGNOSIS — E782 Mixed hyperlipidemia: Secondary | ICD-10-CM

## 2018-03-11 DIAGNOSIS — R630 Anorexia: Secondary | ICD-10-CM | POA: Diagnosis not present

## 2018-03-11 DIAGNOSIS — F339 Major depressive disorder, recurrent, unspecified: Secondary | ICD-10-CM

## 2018-03-11 MED ORDER — PANTOPRAZOLE SODIUM 40 MG PO TBEC: 40.0000 mg | DELAYED_RELEASE_TABLET | Freq: Every day | ORAL | 3 refills | Status: DC

## 2018-03-11 MED ORDER — CETIRIZINE HCL 10 MG PO TBDP: 1.0000 | ORAL_TABLET | Freq: Every day | ORAL | 2 refills | Status: DC

## 2018-03-11 MED ORDER — MIRTAZAPINE 7.5 MG PO TABS
7.5000 mg | ORAL_TABLET | Freq: Every day | ORAL | 5 refills | Status: DC
Start: 2018-03-11 — End: 2018-10-27

## 2018-03-11 MED ORDER — CYCLOBENZAPRINE HCL 10 MG PO TABS: ORAL_TABLET | ORAL | 1 refills | Status: DC

## 2018-03-11 NOTE — Progress Notes (Signed)
   Carrie Perez     MRN: [id number]      DOB: 1961/02/22   HPI Carrie Perez is here for follow up and re-evaluation of chronic medical conditions, medication management and review of any available recent lab and radiology data.  Preventive health is updated, specifically  Cancer screening and Immunization.   States she feels much better since last visit. Filled but has not been taking her megace, let the Remeron get in her system, her appetite and sex life has improved. Also doing better with the thyroid medication Smokes 4 cigarettes per day, knows she should quit but not ready, she is counseled. Needs colonoscopy will pursue this.Denies change in bM and has no f/h colon cancer c/o low back spasm intermittently esp after lon work hours, has benefited in the past form muscle relaxant and requests same Seen in the ED in February and treated for GERD , states she is feeling much better on currenttreatment   ROS Denies recent fever or chills. Denies sinus pressure, nasal congestion, ear pain or sore throat. Denies chest congestion, productive cough or wheezing. Denies chest pains, palpitations and leg swelling Denies abdominal pain, nausea, vomiting,diarrhea or constipation.   Denies dysuria, frequency, hesitancy or incontinence.  Denies headaches, seizures, numbness, or tingling. Denies uncontrolled  depression, anxiety or insomnia. Denies skin break down or rash.   PE  BP 100/72   Pulse 71   Resp 16   Ht 5\' 3"  (1.6 m)   Wt 111 lb (50.3 kg)   LMP 11/30/2014   SpO2 96%   BMI 19.66 kg/m   Patient alert and oriented and in no cardiopulmonary distress.  HEENT: No facial asymmetry, EOMI,   oropharynx pink and moist.  Neck supple no JVD, no mass.  Chest: Clear to auscultation bilaterally.  CVS: S1, S2 no murmurs, no S3.Regular rate.  ABD: Soft non tender.   Ext: No edema  MS: Adequate ROM spine, shoulders, hips and knees.  Skin: Intact, no ulcerations or rash  noted.  Psych: Good eye contact, normal affect. Memory intact not anxious or depressed appearing.  CNS: CN 2-12 intact, power,  normal throughout.no focal deficits noted.   Assessment & Plan  Depression, recurrent ([place]) Well controlled on current medication , continue same  Poor appetite Resolved with remeron 7.5 mg daily continue low dose remeron  Current smoker Asked: confirm current cigarette smoker 4/day Advised: needs to quit to reduce risk of all cancers, mother has breast cancer and also a sister, also risk of heart disease, stroke and lung disease and COPD Assess: not willing to set quit date but willing to cut back Assist: given 1800 QUIT NOW #, written quit tips and also discussed benefits of quitting for 4 minutes Arrange : f/u in 4 to 6 months   Muscle spasm of back Intermittent back spasm flexeril for as needed use is prescribed  Allergic rhinitis Controlled, no change in medication   GERD (gastroesophageal reflux disease) Controlled, no change in medication

## 2018-03-11 NOTE — Assessment & Plan Note (Signed)
Resolved with remeron 7.5 mg daily continue low dose remeron

## 2018-03-11 NOTE — Assessment & Plan Note (Addendum)
Well controlled on current medication , continue same

## 2018-03-11 NOTE — Patient Instructions (Addendum)
F/u in 6 months, call if you need me before  You NEED to QUIT smOkuing to reduce your risk of heart disease , stroke,EARLY DEATH all types of cancer, plus you want to use your $$ for other things, they cost too MUCH, in terms of dollars , t you r heralth your life  Steps to Quit Smoking Smoking tobacco can be bad for your health. It can also affect almost every organ in your body. Smoking puts you and people around you at risk for many serious long-lasting (chronic) diseases. Quitting smoking is hard, but it is one of the best things that you can do for your health. It is never too late to quit. What are the benefits of quitting smoking? When you quit smoking, you lower your risk for getting serious diseases and conditions. They can include:  Lung cancer or lung disease.  Heart disease.  Stroke.  Heart attack.  Not being able to have children (infertility).  Weak bones (osteoporosis) and broken bones (fractures).  If you have coughing, wheezing, and shortness of breath, those symptoms may get better when you quit. You may also get sick less often. If you are pregnant, quitting smoking can help to lower your chances of having a baby of low birth weight. What can I do to help me quit smoking? Talk with your doctor about what can help you quit smoking. Some things you can do (strategies) include:  Quitting smoking totally, instead of slowly cutting back how much you smoke over a period of time.  Going to in-person counseling. You are more likely to quit if you go to many counseling sessions.  Using resources and support systems, such as: ? Database administrator with a Social worker. ? Phone quitlines. ? Careers information officer. ? Support groups or group counseling. ? Text messaging programs. ? Mobile phone apps or applications.  Taking medicines. Some of these medicines may have nicotine in them. If you are pregnant or breastfeeding, do not take any medicines to quit smoking unless your  doctor says it is okay. Talk with your doctor about counseling or other things that can help you.  Talk with your doctor about using more than one strategy at the same time, such as taking medicines while you are also going to in-person counseling. This can help make quitting easier. What things can I do to make it easier to quit? Quitting smoking might feel very hard at first, but there is a lot that you can do to make it easier. Take these steps:  Talk to your family and friends. Ask them to support and encourage you.  Call phone quitlines, reach out to support groups, or work with a Social worker.  Ask people who smoke to not smoke around you.  Avoid places that make you want (trigger) to smoke, such as: ? Bars. ? Parties. ? Smoke-break areas at work.  Spend time with people who do not smoke.  Lower the stress in your life. Stress can make you want to smoke. Try these things to help your stress: ? Getting regular exercise. ? Deep-breathing exercises. ? Yoga. ? Meditating. ? Doing a body scan. To do this, close your eyes, focus on one area of your body at a time from head to toe, and notice which parts of your body are tense. Try to relax the muscles in those areas.  Download or buy apps on your mobile phone or tablet that can help you stick to your quit plan. There are many free apps,  such as QuitGuide from the [employer] Office manager for Disease Control and Prevention). You can find more support from smokefree.gov and other websites.  This information is not intended to replace advice given to you by your health care provider. Make sure you discuss any questions you have with your health care provider. Document Released: 08/31/2009 Document Revised: 07/02/2016 Document Reviewed: 03/21/2015 Elsevier Interactive Patient Education  2018 [place] arrnage and get yopur colonoscopy, call your insurance for help

## 2018-03-23 ENCOUNTER — Encounter: Payer: Self-pay | Admitting: Family Medicine

## 2018-03-23 DIAGNOSIS — M62830 Muscle spasm of back: Secondary | ICD-10-CM | POA: Insufficient documentation

## 2018-03-23 NOTE — Assessment & Plan Note (Signed)
Controlled, no change in medication  

## 2018-03-23 NOTE — Assessment & Plan Note (Signed)
Intermittent back spasm flexeril for as needed use is prescribed

## 2018-03-23 NOTE — Assessment & Plan Note (Signed)
Asked: confirm current cigarette smoker 4/day Advised: needs to quit to reduce risk of all cancers, mother has breast cancer and also a sister, also risk of heart disease, stroke and lung disease and COPD Assess: not willing to set quit date but willing to cut back Assist: given 1800 QUIT NOW #, written quit tips and also discussed benefits of quitting for 4 minutes Arrange : f/u in 4 to 6 months

## 2018-03-24 ENCOUNTER — Ambulatory Visit: Payer: BLUE CROSS/BLUE SHIELD

## 2018-04-05 ENCOUNTER — Other Ambulatory Visit: Payer: Self-pay | Admitting: "Endocrinology

## 2018-04-06 ENCOUNTER — Telehealth: Payer: Self-pay | Admitting: "Endocrinology

## 2018-04-06 NOTE — Telephone Encounter (Signed)
Carrie Perez is calling requesting a refill on levothyroxine (SYNTHROID, LEVOTHROID) 25 MCG tablet  Please advise?

## 2018-04-06 NOTE — Telephone Encounter (Signed)
Rx sent 

## 2018-04-09 DIAGNOSIS — E039 Hypothyroidism, unspecified: Secondary | ICD-10-CM | POA: Diagnosis not present

## 2018-04-10 LAB — THYROID PEROXIDASE ANTIBODY: Thyroperoxidase Ab SerPl-aCnc: 66 IU/mL — ABNORMAL HIGH (ref <9)

## 2018-04-10 LAB — TSH: TSH: 0.68 mIU/L

## 2018-04-10 LAB — THYROGLOBULIN ANTIBODY: THYROGLOBULIN AB: 4 [IU]/mL — AB (ref <=1)

## 2018-04-10 LAB — T4, FREE: FREE T4: 1.2 ng/dL (ref 0.8–1.8)

## 2018-04-10 LAB — T3, FREE: T3, Free: 3.1 pg/mL (ref 2.3–4.2)

## 2018-04-17 ENCOUNTER — Ambulatory Visit (INDEPENDENT_AMBULATORY_CARE_PROVIDER_SITE_OTHER): Payer: BLUE CROSS/BLUE SHIELD | Admitting: "Endocrinology

## 2018-04-17 ENCOUNTER — Encounter: Payer: Self-pay | Admitting: "Endocrinology

## 2018-04-17 VITALS — BP 128/74 | HR 71 | Ht 63.0 in | Wt 114.6 lb

## 2018-04-17 DIAGNOSIS — E063 Autoimmune thyroiditis: Secondary | ICD-10-CM | POA: Diagnosis not present

## 2018-04-17 DIAGNOSIS — E038 Other specified hypothyroidism: Secondary | ICD-10-CM

## 2018-04-17 MED ORDER — LEVOTHYROXINE SODIUM 25 MCG PO TABS: 37.5000 ug | ORAL_TABLET | Freq: Every day | ORAL | 4 refills | Status: DC
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
## 2018-04-29 ENCOUNTER — Encounter: Payer: Self-pay | Admitting: Family Medicine

## 2018-08-06 DIAGNOSIS — F17210 Nicotine dependence, cigarettes, uncomplicated: Secondary | ICD-10-CM | POA: Diagnosis not present

## 2018-08-06 DIAGNOSIS — Z008 Encounter for other general examination: Secondary | ICD-10-CM | POA: Diagnosis not present

## 2018-08-06 DIAGNOSIS — Z719 Counseling, unspecified: Secondary | ICD-10-CM | POA: Diagnosis not present

## 2018-08-06 DIAGNOSIS — Z716 Tobacco abuse counseling: Secondary | ICD-10-CM | POA: Diagnosis not present

## 2018-08-06 DIAGNOSIS — E038 Other specified hypothyroidism: Secondary | ICD-10-CM | POA: Diagnosis not present

## 2018-08-06 DIAGNOSIS — Z1331 Encounter for screening for depression: Secondary | ICD-10-CM | POA: Diagnosis not present

## 2018-09-10 ENCOUNTER — Ambulatory Visit (INDEPENDENT_AMBULATORY_CARE_PROVIDER_SITE_OTHER): Payer: BLUE CROSS/BLUE SHIELD | Admitting: Family Medicine

## 2018-09-10 ENCOUNTER — Encounter: Payer: Self-pay | Admitting: Family Medicine

## 2018-09-10 VITALS — BP 110/60 | HR 60 | Resp 12 | Ht 62.0 in | Wt 113.1 lb

## 2018-09-10 DIAGNOSIS — M62830 Muscle spasm of back: Secondary | ICD-10-CM

## 2018-09-10 DIAGNOSIS — F17200 Nicotine dependence, unspecified, uncomplicated: Secondary | ICD-10-CM | POA: Diagnosis not present

## 2018-09-10 DIAGNOSIS — Z1211 Encounter for screening for malignant neoplasm of colon: Secondary | ICD-10-CM

## 2018-09-10 DIAGNOSIS — E785 Hyperlipidemia, unspecified: Secondary | ICD-10-CM

## 2018-09-10 DIAGNOSIS — Z23 Encounter for immunization: Secondary | ICD-10-CM | POA: Diagnosis not present

## 2018-09-10 DIAGNOSIS — E039 Hypothyroidism, unspecified: Secondary | ICD-10-CM

## 2018-09-10 DIAGNOSIS — E063 Autoimmune thyroiditis: Secondary | ICD-10-CM | POA: Diagnosis not present

## 2018-09-10 DIAGNOSIS — J309 Allergic rhinitis, unspecified: Secondary | ICD-10-CM

## 2018-09-10 DIAGNOSIS — E038 Other specified hypothyroidism: Secondary | ICD-10-CM | POA: Diagnosis not present

## 2018-09-10 DIAGNOSIS — K219 Gastro-esophageal reflux disease without esophagitis: Secondary | ICD-10-CM

## 2018-09-10 LAB — TSH: TSH: 2.79 m[IU]/L

## 2018-09-10 LAB — T4, FREE: FREE T4: 0.9 ng/dL (ref 0.8–1.8)

## 2018-09-10 NOTE — Assessment & Plan Note (Signed)
Controlled, no change in medication  

## 2018-09-10 NOTE — Assessment & Plan Note (Signed)
Improved uses PPI only as needed, dose reduction to be followed through in the future

## 2018-09-10 NOTE — Assessment & Plan Note (Signed)
Intermittent need for muscle relaxant continues

## 2018-09-10 NOTE — Assessment & Plan Note (Signed)
Hyperlipidemia:Low fat diet discussed and encouraged.   Lipid Panel  Lab Results  Component Value Date   CHOL 242 (H) 12/11/2017   HDL 62 12/11/2017   LDLCALC 155 (H) 12/11/2017   TRIG 124 12/11/2017   CHOLHDL 3.9 12/11/2017     Updated lab needed at/ before next visit.

## 2018-09-10 NOTE — Assessment & Plan Note (Signed)
Asked: smokes 5 cigarettes/ day Assess: wants to quit by Feb 2020 Advise: needs to quit to reduce risk of stroke, heart disease and cancer Assist: counsel for 5 mins and info provided Arrange f/u in 5 months

## 2018-09-10 NOTE — Patient Instructions (Addendum)
Physical exam 2nd week in April call if you need me before, to get shingrix #1 at visit also  Flu vaccine today  Cologuard to be set up at visit  Fasting CBC, lipid, cmp and EGFr first week in April, leave with lab sheet please  Current smoking 5/ day Call 1800QUITNOW 24/7 for free help  Join program at work to help you to quit, QUIT date set for  Feb 1, [employer]GR[employer]      Steps to Quit Smoking Smoking tobacco can be bad for your health. It can also affect almost every organ in your body. Smoking puts you and people around you at risk for many serious long-lasting (chronic) diseases. Quitting smoking is hard, but it is one of the best things that you can do for your health. It is never too late to quit. What are the benefits of quitting smoking? When you quit smoking, you lower your risk for getting serious diseases and conditions. They can include:  Lung cancer or lung disease.  Heart disease.  Stroke.  Heart attack.  Not being able to have children (infertility).  Weak bones (osteoporosis) and broken bones (fractures).  If you have coughing, wheezing, and shortness of breath, those symptoms may get better when you quit. You may also get sick less often. If you are pregnant, quitting smoking can help to lower your chances of having a baby of low birth weight. What can I do to help me quit smoking? Talk with your doctor about what can help you quit smoking. Some things you can do (strategies) include:  Quitting smoking totally, instead of slowly cutting back how much you smoke over a period of time.  Going to in-person counseling. You are more likely to quit if you go to many counseling sessions.  Using resources and support systems, such as: ? Database administrator with a Social worker. ? Phone quitlines. ? Careers information officer. ? Support groups or group counseling. ? Text messaging programs. ? Mobile phone apps or applications.  Taking medicines. Some of these medicines may  have nicotine in them. If you are pregnant or breastfeeding, do not take any medicines to quit smoking unless your doctor says it is okay. Talk with your doctor about counseling or other things that can help you.  Talk with your doctor about using more than one strategy at the same time, such as taking medicines while you are also going to in-person counseling. This can help make quitting easier. What things can I do to make it easier to quit? Quitting smoking might feel very hard at first, but there is a lot that you can do to make it easier. Take these steps:  Talk to your family and friends. Ask them to support and encourage you.  Call phone quitlines, reach out to support groups, or work with a Social worker.  Ask people who smoke to not smoke around you.  Avoid places that make you want (trigger) to smoke, such as: ? Bars. ? Parties. ? Smoke-break areas at work.  Spend time with people who do not smoke.  Lower the stress in your life. Stress can make you want to smoke. Try these things to help your stress: ? Getting regular exercise. ? Deep-breathing exercises. ? Yoga. ? Meditating. ? Doing a body scan. To do this, close your eyes, focus on one area of your body at a time from head to toe, and notice which parts of your body are tense. Try to relax the muscles in those areas.  Download or buy apps on your mobile phone or tablet that can help you stick to your quit plan. There are many free apps, such as QuitGuide from the [employer] Office manager for Disease Control and Prevention). You can find more support from smokefree.gov and other websites.  This information is not intended to replace advice given to you by your health care provider. Make sure you discuss any questions you have with your health care provider. Document Released: 08/31/2009 Document Revised: 07/02/2016 Document Reviewed: 03/21/2015 Elsevier Interactive Patient Education  2018 [employer].

## 2018-09-10 NOTE — Assessment & Plan Note (Signed)
Stable and managed by endo 

## 2018-09-10 NOTE — Progress Notes (Signed)
   Carrie Perez     MRN: [id number]      DOB: Jun 11, 1961   HPI [name] is here for follow up and re-evaluation of chronic medical conditions, medication management and review of any available recent lab and radiology data.  Preventive health is updated, specifically  Cancer screening and Immunization.   Questions or concerns regarding consultations or procedures which the PT has had in the interim are  Addressed.Holding out on colonoscopy at this time Follows with endo re thyroid and feels well The PT denies any adverse reactions to current medications since the last visit.  Wants to quit smoking and is getting help through her job  ROS Denies recent fever or chills. Denies sinus pressure, nasal congestion, ear pain or sore throat. Denies chest congestion, productive cough or wheezing. Denies chest pains, palpitations and leg swelling Denies abdominal pain, nausea, vomiting,diarrhea or constipation.   Denies dysuria, frequency, hesitancy or incontinence. Denies joint pain, swelling and limitation in mobility. Denies headaches, seizures, numbness, or tingling. Denies depression, anxiety or insomnia. Denies skin break down or rash.   PE  BP 110/60 (BP Location: Right Arm, Patient Position: Sitting, Cuff Size: Normal)   Pulse 60   Resp 12   Ht 5\' 2"  (1.575 m)   Wt 113 lb 1.9 oz (51.3 kg)   LMP 11/30/2014   SpO2 100% Comment: room air  BMI 20.69 kg/m   Patient alert and oriented and in no cardiopulmonary distress.  HEENT: No facial asymmetry, EOMI,   oropharynx pink and moist.  Neck supple no JVD, no mass.  Chest: Clear to auscultation bilaterally.  CVS: S1, S2 no murmurs, no S3.Regular rate.  ABD: Soft non tender.   Ext: No edema  MS: Adequate ROM spine, shoulders, hips and knees.  Skin: Intact, no ulcerations or rash noted.  Psych: Good eye contact, normal affect. Memory intact not anxious or depressed appearing.  CNS: CN 2-12 intact, power,  normal  throughout.no focal deficits noted.   Assessment & Plan  Current smoker Asked: smokes 5 cigarettes/ day Assess: wants to quit by Feb 2020 Advise: needs to quit to reduce risk of stroke, heart disease and cancer Assist: counsel for 5 mins and info provided Arrange f/u in 5 months  Hyperlipemia Hyperlipidemia:Low fat diet discussed and encouraged.   Lipid Panel  Lab Results  Component Value Date   CHOL 242 (H) 12/11/2017   HDL 62 12/11/2017   LDLCALC 155 (H) 12/11/2017   TRIG 124 12/11/2017   CHOLHDL 3.9 12/11/2017     Updated lab needed at/ before next visit.   Muscle spasm of back Intermittent need for muscle relaxant continues  GERD (gastroesophageal reflux disease) Improved uses PPI only as needed, dose reduction to be followed through in the future  Allergic rhinitis Controlled, no change in medication   Acquired hypothyroidism Stable and managed by endo

## 2018-09-14 ENCOUNTER — Telehealth: Payer: Self-pay | Admitting: Family Medicine

## 2018-09-14 ENCOUNTER — Other Ambulatory Visit: Payer: Self-pay | Admitting: Family Medicine

## 2018-09-14 MED ORDER — PREDNISONE 5 MG (21) PO TBPK: 5.0000 mg | ORAL_TABLET | ORAL | 0 refills | Status: DC

## 2018-09-14 NOTE — Telephone Encounter (Signed)
Pt is calling stating that she needs some predisone, as her Allergies are acting up and around the base of her neck, and her elbow are black raised bumps, not eczema but like it

## 2018-09-14 NOTE — Telephone Encounter (Signed)
Med sent to [facility] and message left for pt

## 2018-09-14 NOTE — Progress Notes (Signed)
pred 

## 2018-09-17 ENCOUNTER — Encounter: Payer: Self-pay | Admitting: "Endocrinology

## 2018-09-17 ENCOUNTER — Ambulatory Visit (INDEPENDENT_AMBULATORY_CARE_PROVIDER_SITE_OTHER): Payer: BLUE CROSS/BLUE SHIELD | Admitting: "Endocrinology

## 2018-09-17 VITALS — BP 130/74 | HR 65 | Ht 62.0 in | Wt 113.0 lb

## 2018-09-17 DIAGNOSIS — E038 Other specified hypothyroidism: Secondary | ICD-10-CM

## 2018-09-17 DIAGNOSIS — E063 Autoimmune thyroiditis: Secondary | ICD-10-CM | POA: Diagnosis not present

## 2018-09-17 DIAGNOSIS — Z1211 Encounter for screening for malignant neoplasm of colon: Secondary | ICD-10-CM | POA: Diagnosis not present

## 2018-09-17 MED ORDER — LEVOTHYROXINE SODIUM 25 MCG PO TABS: 37.5000 ug | ORAL_TABLET | Freq: Every day | ORAL | 3 refills | Status: DC
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
## 2018-09-25 LAB — COLOGUARD: Cologuard: POSITIVE

## 2018-09-28 ENCOUNTER — Telehealth: Payer: Self-pay | Admitting: Family Medicine

## 2018-09-28 ENCOUNTER — Other Ambulatory Visit: Payer: Self-pay | Admitting: Family Medicine

## 2018-09-28 DIAGNOSIS — R195 Other fecal abnormalities: Secondary | ICD-10-CM

## 2018-09-28 NOTE — Progress Notes (Signed)
[facility]  

## 2018-09-28 NOTE — Telephone Encounter (Signed)
Cologuard test result discussed with pt she  Has become very distraught, feels stressed , crying overwhelmed , she is being referred to dr Laural Golden and understand that a letter will be mailed to go to the office for an appointment , before the colonoscopy

## 2018-10-06 ENCOUNTER — Ambulatory Visit (INDEPENDENT_AMBULATORY_CARE_PROVIDER_SITE_OTHER): Payer: BLUE CROSS/BLUE SHIELD | Admitting: Internal Medicine

## 2018-10-06 ENCOUNTER — Encounter (INDEPENDENT_AMBULATORY_CARE_PROVIDER_SITE_OTHER): Payer: Self-pay | Admitting: Internal Medicine

## 2018-10-06 ENCOUNTER — Encounter (INDEPENDENT_AMBULATORY_CARE_PROVIDER_SITE_OTHER): Payer: Self-pay | Admitting: *Deleted

## 2018-10-06 ENCOUNTER — Telehealth (INDEPENDENT_AMBULATORY_CARE_PROVIDER_SITE_OTHER): Payer: Self-pay | Admitting: *Deleted

## 2018-10-06 VITALS — BP 112/58 | HR 60 | Temp 97.8°F | Ht 63.0 in | Wt 110.5 lb

## 2018-10-06 DIAGNOSIS — R195 Other fecal abnormalities: Secondary | ICD-10-CM

## 2018-10-06 HISTORY — DX: Other fecal abnormalities: R19.5

## 2018-10-06 MED ORDER — SUPREP BOWEL PREP KIT 17.5-3.13-1.6 GM/177ML PO SOLN: 1.0000 | Freq: Once | ORAL | 0 refills | Status: AC
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
## 2018-10-06 NOTE — Telephone Encounter (Signed)
Patient needs suprep 

## 2018-10-06 NOTE — Patient Instructions (Signed)
The risks of bleeding, perforation and infection were reviewed with patient.  

## 2018-10-14 ENCOUNTER — Encounter (HOSPITAL_COMMUNITY)
Admission: RE | Disposition: A | Discharge status: Home / Self Care | Payer: Self-pay | Source: Ambulatory Visit | Attending: Internal Medicine

## 2018-10-14 ENCOUNTER — Encounter (HOSPITAL_COMMUNITY): Payer: Self-pay | Admitting: *Deleted

## 2018-10-14 ENCOUNTER — Ambulatory Visit (HOSPITAL_COMMUNITY)
Admission: RE | Admit: 2018-10-14 | Discharge: 2018-10-14 | Disposition: A | Discharge status: Home / Self Care | Payer: BLUE CROSS/BLUE SHIELD | Source: Ambulatory Visit | Attending: Internal Medicine | Admitting: Internal Medicine

## 2018-10-14 ENCOUNTER — Other Ambulatory Visit: Payer: Self-pay

## 2018-10-14 DIAGNOSIS — K644 Residual hemorrhoidal skin tags: Secondary | ICD-10-CM | POA: Insufficient documentation

## 2018-10-14 DIAGNOSIS — R195 Other fecal abnormalities: Secondary | ICD-10-CM | POA: Diagnosis not present

## 2018-10-14 DIAGNOSIS — K621 Rectal polyp: Secondary | ICD-10-CM | POA: Insufficient documentation

## 2018-10-14 DIAGNOSIS — Z79899 Other long term (current) drug therapy: Secondary | ICD-10-CM | POA: Diagnosis not present

## 2018-10-14 DIAGNOSIS — F17210 Nicotine dependence, cigarettes, uncomplicated: Secondary | ICD-10-CM | POA: Insufficient documentation

## 2018-10-14 DIAGNOSIS — Z880 Allergy status to penicillin: Secondary | ICD-10-CM | POA: Diagnosis not present

## 2018-10-14 DIAGNOSIS — D127 Benign neoplasm of rectosigmoid junction: Secondary | ICD-10-CM | POA: Diagnosis not present

## 2018-10-14 DIAGNOSIS — K635 Polyp of colon: Secondary | ICD-10-CM | POA: Diagnosis not present

## 2018-10-14 HISTORY — PX: POLYPECTOMY: SHX5525

## 2018-10-14 HISTORY — PX: COLONOSCOPY: SHX5424

## 2018-10-14 SURGERY — COLONOSCOPY
Anesthesia: Moderate Sedation

## 2018-10-14 MED ORDER — MEPERIDINE HCL 50 MG/ML IJ SOLN
INTRAMUSCULAR | Status: DC | PRN
  Administered 2018-10-14: 20 mg via INTRAVENOUS

## 2018-10-14 MED ORDER — MIDAZOLAM HCL 5 MG/5ML IJ SOLN
INTRAMUSCULAR | Status: AC
  Filled 2018-10-14: qty 10

## 2018-10-14 MED ORDER — STERILE WATER FOR IRRIGATION IR SOLN
Status: DC | PRN
  Administered 2018-10-14: 08:00:00

## 2018-10-14 MED ORDER — SODIUM CHLORIDE 0.9 % IV SOLN
INTRAVENOUS | Status: DC
  Administered 2018-10-14: 07:00:00 via INTRAVENOUS

## 2018-10-14 MED ORDER — MEPERIDINE HCL 50 MG/ML IJ SOLN
INTRAMUSCULAR | Status: AC
  Filled 2018-10-14: qty 1

## 2018-10-14 MED ORDER — MIDAZOLAM HCL 5 MG/5ML IJ SOLN
INTRAMUSCULAR | Status: DC | PRN
  Administered 2018-10-14: 2 mg via INTRAVENOUS
  Administered 2018-10-14: 1 mg via INTRAVENOUS
  Administered 2018-10-14: 2 mg via INTRAVENOUS

## 2018-10-14 NOTE — Op Note (Signed)
[facility] Patient Name: Carrie Perez Procedure Date: 10/14/2018 7:25 AM MRN: [id number] Date of Birth: [birth year] Attending MD: Hildred Laser , MD CSN: [id number] Age: 57 Admit Type: Outpatient Procedure:                Colonoscopy Indications:              Positive Cologuard test Providers:                Hildred Laser, MD, Rosina Lowenstein, RN, Nelma Rothman,                            Technician Referring MD:             Norwood Levo. Moshe Cipro, MD Medicines:                Meperidine 20 mg IV, Midazolam 5 mg IV Complications:            No immediate complications. Estimated Blood Loss:     Estimated blood loss was minimal. Procedure:                Pre-Anesthesia Assessment:                           - Prior to the procedure, a History and Physical                            was performed, and patient medications and                            allergies were reviewed. The patient's tolerance of                            previous anesthesia was also reviewed. The risks                            and benefits of the procedure and the sedation                            options and risks were discussed with the patient.                            All questions were answered, and informed consent                            was obtained. Prior Anticoagulants: The patient has                            taken no previous anticoagulant or antiplatelet                            agents. ASA Grade Assessment: II - A patient with                            mild systemic disease. After reviewing the risks  and benefits, the patient was deemed in                            satisfactory condition to undergo the procedure.                           After obtaining informed consent, the colonoscope                            was passed under direct vision. Throughout the                            procedure, the patient's blood pressure, pulse, and       oxygen saturations were monitored continuously. The                            [id number]-[id number] ([id number]) was introduced through the                            anus and advanced to the the cecum, identified by                            appendiceal orifice and ileocecal valve. The                            colonoscopy was performed without difficulty. The                            patient tolerated the procedure well. The quality                            of the bowel preparation was excellent. The                            ileocecal valve, appendiceal orifice, and rectum                            were photographed. Scope In: 7:43:20 AM Scope Out: 8:07:01 AM Scope Withdrawal Time: 0 hours 11 minutes 52 seconds  Total Procedure Duration: 0 hours 23 minutes 41 seconds  Findings:      The perianal and digital rectal examinations were normal.      Four sessile polyps were found in the rectum and recto-sigmoid colon.       The polyps were diminutive in size. These were biopsied with a cold       forceps for histology. The pathology specimen was placed into Bottle       Number 1.      The exam was otherwise normal throughout the examined colon.      External hemorrhoids were found during retroflexion. The hemorrhoids       were small. Impression:               - Four diminutive polyps in the rectum and at the  recto-sigmoid colon. Biopsied.                           - External hemorrhoids. Moderate Sedation:      Moderate (conscious) sedation was administered by the endoscopy nurse       and supervised by the endoscopist. The following parameters were       monitored: oxygen saturation, heart rate, blood pressure, CO2       capnography and response to care. Total physician intraservice time was       30 minutes. Recommendation:           - Patient has a contact number available for                            emergencies. The signs and symptoms of potential                             delayed complications were discussed with the                            patient. Return to normal activities tomorrow.                            Written discharge instructions were provided to the                            patient.                           - Resume previous diet today.                           - Continue present medications.                           - No aspirin, ibuprofen, naproxen, or other                            non-steroidal anti-inflammatory drugs for 1 day.                           - Await pathology results.                           - Repeat colonoscopy is recommended. The                            colonoscopy date will be determined after pathology                            results from today's exam become available for                            review. Procedure Code(s):        --- Professional ---  52778, Colonoscopy, flexible; with biopsy, single                            or multiple                           99153, Moderate sedation; each additional 15                            minutes intraservice time                           G0500, Moderate sedation services provided by the                            same physician or other qualified health care                            professional performing a gastrointestinal                            endoscopic service that sedation supports,                            requiring the presence of an independent trained                            observer to assist in the monitoring of the                            patient's level of consciousness and physiological                            status; initial 15 minutes of intra-service time;                            patient age 68 years or older (additional time may                            be reported with [phone], as appropriate) Diagnosis Code(s):        --- Professional ---                            K62.1, Rectal polyp                           D12.7, Benign neoplasm of rectosigmoid junction                           K64.4, Residual hemorrhoidal skin tags                           R19.5, Other fecal abnormalities CPT copyright 2018 American Medical Association. All rights reserved. The codes documented in this report are preliminary and upon coder review may  be revised to meet current compliance requirements. Hildred Laser, MD  Hildred Laser, MD 10/14/2018 8:16:16 AM This report has been signed electronically. Number of Addenda: 0

## 2018-10-14 NOTE — Discharge Instructions (Signed)
No aspirin or NSAIDs for 24 hours. Resume usual medications as before. Resume usual diet. No driving for 24 hours. Physician will call with biopsy results.  Colonoscopy, Adult, Care After This sheet gives you information about how to care for yourself after your procedure. Your health care provider may also give you more specific instructions. If you have problems or questions, contact your health care provider. What can I expect after the procedure? After the procedure, it is common to have:  A small amount of blood in your stool for 24 hours after the procedure.  Some gas.  Mild abdominal cramping or bloating.  Follow these instructions at home: General instructions   For the first 24 hours after the procedure: ? Do not drive or use machinery. ? Do not sign important documents. ? Do not drink alcohol. ? Do your regular daily activities at a slower pace than normal. ? Eat soft, easy-to-digest foods. ? Rest often.  Take over-the-counter or prescription medicines only as told by your health care provider.  It is up to you to get the results of your procedure. Ask your health care provider, or the department performing the procedure, when your results will be ready. Relieving cramping and bloating  Try walking around when you have cramps or feel bloated.  Apply heat to your abdomen as told by your health care provider. Use a heat source that your health care provider recommends, such as a moist heat pack or a heating pad. ? Place a towel between your skin and the heat source. ? Leave the heat on for 20-30 minutes. ? Remove the heat if your skin turns bright red. This is especially important if you are unable to feel pain, heat, or cold. You may have a greater risk of getting burned. Eating and drinking  Drink enough fluid to keep your urine clear or pale yellow.  Resume your normal diet as instructed by your health care provider. Avoid heavy or fried foods that are hard to  digest.  Avoid drinking alcohol for as long as instructed by your health care provider. Contact a health care provider if:  You have blood in your stool 2-3 days after the procedure. Get help right away if:  You have more than a small spotting of blood in your stool.  You pass large blood clots in your stool.  Your abdomen is swollen.  You have nausea or vomiting.  You have a fever.  You have increasing abdominal pain that is not relieved with medicine. This information is not intended to replace advice given to you by your health care provider. Make sure you discuss any questions you have with your health care provider. Document Released: 06/18/2004 Document Revised: 07/29/2016 Document Reviewed: 01/16/2016 Elsevier Interactive Patient Education  [employer].
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
## 2018-10-20 ENCOUNTER — Encounter (HOSPITAL_COMMUNITY): Payer: Self-pay | Admitting: Emergency Medicine

## 2018-10-20 ENCOUNTER — Emergency Department (HOSPITAL_COMMUNITY)
Admission: EM | Admit: 2018-10-20 | Discharge: 2018-10-20 | Disposition: A | Discharge status: Home / Self Care | Payer: BLUE CROSS/BLUE SHIELD | Attending: Emergency Medicine | Admitting: Emergency Medicine

## 2018-10-20 ENCOUNTER — Other Ambulatory Visit: Payer: Self-pay

## 2018-10-20 DIAGNOSIS — Z716 Tobacco abuse counseling: Secondary | ICD-10-CM | POA: Diagnosis not present

## 2018-10-20 DIAGNOSIS — R0789 Other chest pain: Secondary | ICD-10-CM

## 2018-10-20 DIAGNOSIS — R55 Syncope and collapse: Secondary | ICD-10-CM

## 2018-10-20 DIAGNOSIS — Z79899 Other long term (current) drug therapy: Secondary | ICD-10-CM | POA: Insufficient documentation

## 2018-10-20 DIAGNOSIS — F17210 Nicotine dependence, cigarettes, uncomplicated: Secondary | ICD-10-CM | POA: Diagnosis not present

## 2018-10-20 LAB — CBC WITH DIFFERENTIAL/PLATELET
ABS IMMATURE GRANULOCYTES: 0.01 10*3/uL (ref 0.00–0.07)
BASOS ABS: 0 10*3/uL (ref 0.0–0.1)
BASOS PCT: 0 %
EOS ABS: 0 10*3/uL (ref 0.0–0.5)
Eosinophils Relative: 1 %
HCT: 39.2 % (ref 36.0–46.0)
Hemoglobin: 12.7 g/dL (ref 12.0–15.0)
IMMATURE GRANULOCYTES: 0 %
Lymphocytes Relative: 31 %
Lymphs Abs: 1.5 10*3/uL (ref 0.7–4.0)
MCH: 29 pg (ref 26.0–34.0)
MCHC: 32.4 g/dL (ref 30.0–36.0)
MCV: 89.5 fL (ref 80.0–100.0)
Monocytes Absolute: 0.3 10*3/uL (ref 0.1–1.0)
Monocytes Relative: 7 %
NEUTROS ABS: 3 10*3/uL (ref 1.7–7.7)
NEUTROS PCT: 61 %
NRBC: 0 % (ref 0.0–0.2)
PLATELETS: 269 10*3/uL (ref 150–400)
RBC: 4.38 MIL/uL (ref 3.87–5.11)
RDW: 14.1 % (ref 11.5–15.5)
WBC: 4.9 10*3/uL (ref 4.0–10.5)

## 2018-10-20 LAB — URINALYSIS, ROUTINE W REFLEX MICROSCOPIC
Bilirubin Urine: NEGATIVE
GLUCOSE, UA: NEGATIVE mg/dL
Hgb urine dipstick: NEGATIVE
Ketones, ur: NEGATIVE mg/dL
LEUKOCYTES UA: NEGATIVE
Nitrite: NEGATIVE
PROTEIN: NEGATIVE mg/dL
SPECIFIC GRAVITY, URINE: 1.017 (ref 1.005–1.030)
pH: 6 (ref 5.0–8.0)

## 2018-10-20 LAB — I-STAT TROPONIN, ED: Troponin i, poc: 0 ng/mL (ref 0.00–0.08)

## 2018-10-20 LAB — COMPREHENSIVE METABOLIC PANEL
ALT: 12 U/L (ref 0–44)
AST: 16 U/L (ref 15–41)
Albumin: 4.3 g/dL (ref 3.5–5.0)
Alkaline Phosphatase: 61 U/L (ref 38–126)
Anion gap: 6 (ref 5–15)
BUN: 15 mg/dL (ref 6–20)
CHLORIDE: 107 mmol/L (ref 98–111)
CO2: 26 mmol/L (ref 22–32)
CREATININE: 0.75 mg/dL (ref 0.44–1.00)
Calcium: 9.7 mg/dL (ref 8.9–10.3)
GFR calc Af Amer: >60 mL/min (ref >60)
GLUCOSE: 94 mg/dL (ref 70–99)
POTASSIUM: 3.9 mmol/L (ref 3.5–5.1)
Sodium: 139 mmol/L (ref 135–145)
Total Bilirubin: 0.6 mg/dL (ref 0.3–1.2)
Total Protein: 7.6 g/dL (ref 6.5–8.1)

## 2018-10-20 LAB — TROPONIN I

## 2018-10-20 MED ORDER — FAMOTIDINE IN NACL 20-0.9 MG/50ML-% IV SOLN
20.0000 mg | Freq: Once | INTRAVENOUS | Status: AC
  Administered 2018-10-20: 20 mg via INTRAVENOUS
  Filled 2018-10-20: qty 50

## 2018-10-20 MED ORDER — SODIUM CHLORIDE 0.9 % IV BOLUS
1000.0000 mL | Freq: Once | INTRAVENOUS | Status: AC
  Administered 2018-10-20: 1000 mL via INTRAVENOUS

## 2018-10-20 MED ORDER — ALUM & MAG HYDROXIDE-SIMETH 200-200-20 MG/5ML PO SUSP
30.0000 mL | Freq: Once | ORAL | Status: AC
  Administered 2018-10-20: 30 mL via ORAL
  Filled 2018-10-20: qty 30

## 2018-10-20 MED ORDER — PANTOPRAZOLE SODIUM 20 MG PO TBEC: 20.0000 mg | DELAYED_RELEASE_TABLET | Freq: Every day | ORAL | 0 refills | Status: DC
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
## 2018-10-20 NOTE — Discharge Instructions (Addendum)
As discussed, your evaluation today has been largely reassuring.  But, it is important that you monitor your condition carefully, and do not hesitate to return to the ED if you develop new, or concerning changes in your condition.  Otherwise, please follow-up with your physician for appropriate ongoing care.  Your medication has been adjusted, please take it as prescribed currently.

## 2018-10-20 NOTE — ED Triage Notes (Addendum)
PT states she was getting ready for work and passed out and leaned against her bathroom door and slid herself to the floor. PT states she has had burning in her upper abdomen today and has been lightheaded. PT states she has had weakness since her colonoscopy on last Wednesday.

## 2018-10-22 ENCOUNTER — Encounter (HOSPITAL_COMMUNITY): Payer: Self-pay | Admitting: Internal Medicine

## 2018-10-26 ENCOUNTER — Telehealth: Payer: Self-pay | Admitting: Family Medicine

## 2018-10-26 NOTE — Telephone Encounter (Signed)
The patient is calling in stating her nerves are bad, and she needs some prednisone

## 2018-10-26 NOTE — Telephone Encounter (Signed)
Pt is coming in the office at 8am 10-27-18

## 2018-10-27 ENCOUNTER — Encounter: Payer: Self-pay | Admitting: *Deleted

## 2018-10-27 ENCOUNTER — Encounter: Payer: Self-pay | Admitting: Family Medicine

## 2018-10-27 ENCOUNTER — Ambulatory Visit (INDEPENDENT_AMBULATORY_CARE_PROVIDER_SITE_OTHER): Payer: BLUE CROSS/BLUE SHIELD | Admitting: Family Medicine

## 2018-10-27 VITALS — BP 138/64 | HR 81 | Resp 12 | Ht 63.0 in | Wt 107.1 lb

## 2018-10-27 DIAGNOSIS — F322 Major depressive disorder, single episode, severe without psychotic features: Secondary | ICD-10-CM | POA: Diagnosis not present

## 2018-10-27 DIAGNOSIS — F419 Anxiety disorder, unspecified: Secondary | ICD-10-CM

## 2018-10-27 DIAGNOSIS — F32A Depression, unspecified: Secondary | ICD-10-CM

## 2018-10-27 DIAGNOSIS — F329 Major depressive disorder, single episode, unspecified: Secondary | ICD-10-CM | POA: Diagnosis not present

## 2018-10-27 DIAGNOSIS — E039 Hypothyroidism, unspecified: Secondary | ICD-10-CM

## 2018-10-27 MED ORDER — BUSPIRONE HCL 5 MG PO TABS: 5.0000 mg | ORAL_TABLET | Freq: Two times a day (BID) | ORAL | 2 refills | Status: DC

## 2018-10-27 MED ORDER — PANTOPRAZOLE SODIUM 40 MG PO TBEC: 40.0000 mg | DELAYED_RELEASE_TABLET | Freq: Every day | ORAL | 2 refills | Status: DC

## 2018-10-27 MED ORDER — MIRTAZAPINE 15 MG PO TABS: 15.0000 mg | ORAL_TABLET | Freq: Every day | ORAL | 2 refills | Status: DC

## 2018-10-27 NOTE — Patient Instructions (Addendum)
F/U in 4 to 5 weeks, call if you need me before  Increase dose of Remeron to 15 mg daily  Increase dose of protonix to 40 mg daily  New is buspar one tablet two times daily  Work excuse for today and tomorrow, return next week Monday

## 2018-10-27 NOTE — Progress Notes (Signed)
   Carrie Perez     MRN: [id number]      DOB: 1961-01-01   HPI Carrie Perez is here with a c/o extreme depression and anxiety , cring, tearful and fearful, states she feels like a bother to everyone, and reports decompensating since her recent colonoscopy, although she is aware that her results are good  ROS Denies recent fever or chills. Denies sinus pressure, nasal congestion, ear pain or sore throat. Denies chest congestion, productive cough or wheezing. Denies chest pains, palpitations and leg swelling Denies abdominal pain, nausea, vomiting,diarrhea or constipation.   Denies dysuria, frequency, hesitancy or incontinence. Denies joint pain, swelling and limitation in mobility. Denies headaches, seizures, numbness, or tingling.  Denies skin break down or rash.   PE  BP 138/64 (BP Location: Left Arm, Patient Position: Sitting, Cuff Size: Normal)   Pulse 81   Resp 12   Ht 5\' 3"  (1.6 m)   Wt 107 lb 1.9 oz (48.6 kg)   LMP 11/30/2014   SpO2 98% Comment: room air  BMI 18.98 kg/m   Patient alert and oriented and in no cardiopulmonary distress.  HEENT: No facial asymmetry, EOMI,   oropharynx pink and moist.  Neck supple no JVD, no mass.  Chest: Clear to auscultation bilaterally.  CVS: S1, S2 no murmurs, no S3.Regular rate.  ABD: Soft non tender.   Ext: No edema  MS: Adequate ROM spine, shoulders, hips and knees.  Skin: Intact, no ulcerations or rash noted.  Psych: Good eye contact, tearful, anxious crying, not syuicidal or homicidal, but feels a bother CNS: CN 2-12 intact, power,  normal throughout.no focal deficits noted.   Assessment & Plan  Depression, major, single episode, severe ([place]) Pt's spouse in to vist after being called, and medication started Work excuse also   Anxiety and depression Start buspar, work excuse written  Acquired hypothyroidism Controlled, no change in medication

## 2018-10-31 ENCOUNTER — Encounter: Payer: Self-pay | Admitting: Family Medicine

## 2018-10-31 DIAGNOSIS — F322 Major depressive disorder, single episode, severe without psychotic features: Secondary | ICD-10-CM | POA: Insufficient documentation

## 2018-10-31 NOTE — Assessment & Plan Note (Signed)
Controlled, no change in medication  

## 2018-10-31 NOTE — Assessment & Plan Note (Signed)
Start buspar, work excuse written

## 2018-10-31 NOTE — Assessment & Plan Note (Signed)
Pt's spouse in to [place] after being called, and medication started Work excuse also

## 2018-11-09 ENCOUNTER — Telehealth: Payer: Self-pay | Admitting: *Deleted

## 2018-11-09 NOTE — Telephone Encounter (Signed)
error 

## 2018-11-18 ENCOUNTER — Other Ambulatory Visit: Payer: Self-pay

## 2018-11-18 ENCOUNTER — Encounter (HOSPITAL_COMMUNITY): Payer: Self-pay | Admitting: Emergency Medicine

## 2018-11-18 ENCOUNTER — Emergency Department (HOSPITAL_COMMUNITY)
Admission: EM | Admit: 2018-11-18 | Discharge: 2018-11-18 | Disposition: A | Discharge status: Home / Self Care | Payer: BLUE CROSS/BLUE SHIELD | Attending: Emergency Medicine | Admitting: Emergency Medicine

## 2018-11-18 DIAGNOSIS — F329 Major depressive disorder, single episode, unspecified: Secondary | ICD-10-CM | POA: Insufficient documentation

## 2018-11-18 DIAGNOSIS — Z79899 Other long term (current) drug therapy: Secondary | ICD-10-CM | POA: Insufficient documentation

## 2018-11-18 DIAGNOSIS — E039 Hypothyroidism, unspecified: Secondary | ICD-10-CM | POA: Diagnosis not present

## 2018-11-18 DIAGNOSIS — F419 Anxiety disorder, unspecified: Secondary | ICD-10-CM | POA: Diagnosis not present

## 2018-11-18 DIAGNOSIS — F411 Generalized anxiety disorder: Secondary | ICD-10-CM | POA: Insufficient documentation

## 2018-11-18 DIAGNOSIS — F17210 Nicotine dependence, cigarettes, uncomplicated: Secondary | ICD-10-CM | POA: Diagnosis not present

## 2018-11-18 DIAGNOSIS — F418 Other specified anxiety disorders: Secondary | ICD-10-CM | POA: Diagnosis present

## 2018-11-18 DIAGNOSIS — R451 Restlessness and agitation: Secondary | ICD-10-CM | POA: Diagnosis not present

## 2018-11-18 LAB — COMPREHENSIVE METABOLIC PANEL
ALT: 17 U/L (ref 0–44)
AST: 25 U/L (ref 15–41)
Albumin: 4.5 g/dL (ref 3.5–5.0)
Alkaline Phosphatase: 59 U/L (ref 38–126)
Anion gap: 7 (ref 5–15)
BUN: 19 mg/dL (ref 6–20)
CO2: 27 mmol/L (ref 22–32)
Calcium: 9.9 mg/dL (ref 8.9–10.3)
Chloride: 107 mmol/L (ref 98–111)
Creatinine, Ser: 0.77 mg/dL (ref 0.44–1.00)
GFR calc Af Amer: >60 mL/min (ref >60)
GFR calc non Af Amer: >60 mL/min (ref >60)
Glucose, Bld: 98 mg/dL (ref 70–99)
POTASSIUM: 3.2 mmol/L — AB (ref 3.5–5.1)
SODIUM: 141 mmol/L (ref 135–145)
Total Bilirubin: 1.4 mg/dL — ABNORMAL HIGH (ref 0.3–1.2)
Total Protein: 7.7 g/dL (ref 6.5–8.1)

## 2018-11-18 LAB — CBC WITH DIFFERENTIAL/PLATELET
Abs Immature Granulocytes: 0.01 10*3/uL (ref 0.00–0.07)
Basophils Absolute: 0 10*3/uL (ref 0.0–0.1)
Basophils Relative: 0 %
Eosinophils Absolute: 0 10*3/uL (ref 0.0–0.5)
Eosinophils Relative: 1 %
HEMATOCRIT: 42 % (ref 36.0–46.0)
HEMOGLOBIN: 13.4 g/dL (ref 12.0–15.0)
Immature Granulocytes: 0 %
Lymphocytes Relative: 28 %
Lymphs Abs: 1.3 10*3/uL (ref 0.7–4.0)
MCH: 28.9 pg (ref 26.0–34.0)
MCHC: 31.9 g/dL (ref 30.0–36.0)
MCV: 90.7 fL (ref 80.0–100.0)
MONOS PCT: 9 %
Monocytes Absolute: 0.4 10*3/uL (ref 0.1–1.0)
Neutro Abs: 2.9 10*3/uL (ref 1.7–7.7)
Neutrophils Relative %: 62 %
Platelets: 297 10*3/uL (ref 150–400)
RBC: 4.63 MIL/uL (ref 3.87–5.11)
RDW: 14.4 % (ref 11.5–15.5)
WBC: 4.7 10*3/uL (ref 4.0–10.5)
nRBC: 0 % (ref 0.0–0.2)

## 2018-11-18 LAB — RAPID URINE DRUG SCREEN, HOSP PERFORMED
Amphetamines: NOT DETECTED
Barbiturates: NOT DETECTED
Benzodiazepines: NOT DETECTED
Cocaine: NOT DETECTED
Opiates: NOT DETECTED
Tetrahydrocannabinol: NOT DETECTED

## 2018-11-18 LAB — TSH: TSH: 2.788 u[IU]/mL (ref 0.350–4.500)

## 2018-11-18 LAB — T4, FREE: Free T4: 0.92 ng/dL (ref 0.82–1.77)

## 2018-11-18 LAB — ETHANOL: Alcohol, Ethyl (B): <10 mg/dL (ref <10)

## 2018-11-18 MED ORDER — LEVOTHYROXINE SODIUM 25 MCG PO TABS
37.5000 ug | ORAL_TABLET | Freq: Every day | ORAL | Status: DC
  Filled 2018-11-18 (×2): qty 1.5

## 2018-11-18 MED ORDER — MIRTAZAPINE 15 MG PO TABS: 15.0000 mg | ORAL_TABLET | Freq: Every day | ORAL | Status: DC

## 2018-11-18 MED ORDER — LORATADINE 10 MG PO TABS: 10.0000 mg | ORAL_TABLET | Freq: Every day | ORAL | Status: DC

## 2018-11-18 MED ORDER — PANTOPRAZOLE SODIUM 40 MG PO TBEC: 40.0000 mg | DELAYED_RELEASE_TABLET | Freq: Every day | ORAL | Status: DC

## 2018-11-18 MED ORDER — POTASSIUM CHLORIDE CRYS ER 20 MEQ PO TBCR
20.0000 meq | EXTENDED_RELEASE_TABLET | Freq: Once | ORAL | Status: AC
  Administered 2018-11-18: 20 meq via ORAL
  Filled 2018-11-18: qty 1

## 2018-11-18 MED ORDER — BUSPIRONE HCL 5 MG PO TABS: 5.0000 mg | ORAL_TABLET | Freq: Two times a day (BID) | ORAL | Status: DC

## 2018-11-18 NOTE — ED Triage Notes (Signed)
Patient has been out of her medication (thyroid) for a while. Family states patient has become depressed, cries everyday, and has had some mental status changes. Patient restless and crying in Triage. Patient not eating or sleeping. Patient with SI.

## 2018-11-18 NOTE — Discharge Instructions (Addendum)
Your evaluated in the emergency department for increasing your anxiety and possibly depression.  Your lab work did not show any obvious cause of your symptoms.  You had an evaluation by behavioral health and they felt that you should follow-up with some outpatient counseling.  We are giving you the some numbers to schedule an appointment.  If you feel that you may harm yourself you should return to the emergency department.
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
## 2018-11-18 NOTE — ED Notes (Signed)
TTS in progress 

## 2018-11-18 NOTE — ED Notes (Signed)
Pt belongings put into locker including purse and clothing. Cell phone and earrings given to pt daughter to take with her. Shoes still on as they do not have strings.

## 2018-11-18 NOTE — ED Notes (Signed)
Patient and family angry during discharge, stating staff only put her "in psycy hold and nothing else".   [facility] recommended discharged as patient did not meet criteria for admission.  Patient is not suicidal or has a plan for killing or hurting self.  Patient was asked again by this nurse if she had a plan for suicide.  Patient denies wanting to kill or hurt self.

## 2018-11-23 ENCOUNTER — Encounter: Payer: Self-pay | Admitting: Family Medicine

## 2018-11-23 ENCOUNTER — Ambulatory Visit (INDEPENDENT_AMBULATORY_CARE_PROVIDER_SITE_OTHER): Payer: BLUE CROSS/BLUE SHIELD | Admitting: Family Medicine

## 2018-11-23 VITALS — BP 120/70 | HR 96 | Resp 15 | Ht 63.0 in | Wt 107.0 lb

## 2018-11-23 DIAGNOSIS — F32A Depression, unspecified: Secondary | ICD-10-CM

## 2018-11-23 DIAGNOSIS — F329 Major depressive disorder, single episode, unspecified: Secondary | ICD-10-CM

## 2018-11-23 DIAGNOSIS — F322 Major depressive disorder, single episode, severe without psychotic features: Secondary | ICD-10-CM | POA: Diagnosis not present

## 2018-11-23 DIAGNOSIS — F419 Anxiety disorder, unspecified: Secondary | ICD-10-CM

## 2018-11-23 DIAGNOSIS — L309 Dermatitis, unspecified: Secondary | ICD-10-CM | POA: Diagnosis not present

## 2018-11-23 DIAGNOSIS — F411 Generalized anxiety disorder: Secondary | ICD-10-CM

## 2018-11-23 DIAGNOSIS — R4586 Emotional lability: Secondary | ICD-10-CM

## 2018-11-23 DIAGNOSIS — F17200 Nicotine dependence, unspecified, uncomplicated: Secondary | ICD-10-CM

## 2018-11-23 MED ORDER — MIRTAZAPINE 15 MG PO TABS: 15.0000 mg | ORAL_TABLET | Freq: Every day | ORAL | 3 refills | Status: DC

## 2018-11-23 MED ORDER — METHYLPREDNISOLONE ACETATE 80 MG/ML IJ SUSP: 80.0000 mg | Freq: Once | INTRAMUSCULAR | Status: DC

## 2018-11-23 MED ORDER — ESCITALOPRAM OXALATE 10 MG PO TABS: 10.0000 mg | ORAL_TABLET | Freq: Every day | ORAL | 3 refills | Status: DC

## 2018-11-23 MED ORDER — METHYLPREDNISOLONE ACETATE 80 MG/ML IJ SUSP
80.0000 mg | Freq: Once | INTRAMUSCULAR | Status: AC
  Administered 2018-11-23: 80 mg via INTRAMUSCULAR

## 2018-11-23 MED ORDER — PREDNISONE 5 MG PO TABS: ORAL_TABLET | ORAL | 0 refills | Status: DC

## 2018-11-23 NOTE — Patient Instructions (Addendum)
F/U in 4.5 months, call if you need me before  DEPOmEDROL 80 MG im IN OFFICE TODAY FOR RASH AND ITCHING  Increase dose of mitarzapine to 15 mg at bedtime , stop buspar since you believe it I caused a rash, new is once daily lexapro for anxiety  You are referred to Psychiatry , they will call you  Please continue to reduce cigarettes, now at 7/day  Call 1800 quit now FOR HELP WITH SMOKING CESSATION, HAPPY BIRTHDAY , AND GIVE YOURSELF A Great GIFTT, love You even more!!  Thank you  for choosing  Primary Care. We consider it a privelige to serve you.  Delivering excellent health care in a caring and  compassionate way is our goal.  Partnering with you,  so that together we can achieve this goal is our strategy.

## 2018-11-23 NOTE — Assessment & Plan Note (Addendum)
PRURITIC RASH ON LEFT POSTEROR NECK AND LOW BACK, THINKS BUSPAR IS THE CULPRIT, depo medrol IM followed by prednisone dose pack, stop buspar

## 2018-11-28 ENCOUNTER — Encounter: Payer: Self-pay | Admitting: Family Medicine

## 2018-11-28 NOTE — Assessment & Plan Note (Signed)
Npo controlled, recently taken to ED and ontolerant of Buspar, start lexapro and refer to Psych

## 2018-11-28 NOTE — Assessment & Plan Note (Signed)
Asked:confirms currently smokes cigarettes 7/ day Assess: Unwilling to quit but cutting back Advise: needs to QUIT to reduce risk of cancer, cardio and cerebrovascular disease Assist: counseled for 5 minutes and literature provided Arrange: follow up in 3 months

## 2018-11-28 NOTE — Assessment & Plan Note (Signed)
Not suicidal or homicidal, PHQ 9 score of 6 in 11/2018, improvement recorded , still eeds Psychiatry , screen likely represents under reporting. Increase Remeron dose and add lexapro in place of buspar

## 2018-11-28 NOTE — Progress Notes (Signed)
   Carrie Perez     MRN: [id number]      DOB: [birth year]   HPI Carrie Perez is here for follow up of severe depression  And anxiety, necessitating recent ED eval when she was taken by her family due to extreme anxiety, had Psychiatric eval and was deemed safe for discharge. C/o increased itching and a rash on posterior right neck, which she believes was caused by buspar. Requests steroids for itch and allergy C/o lack of weight gain and poor sleep, dose of remeron will be increased and lexapro added in place of Buspar, psych management also indicated esp in ;light of her extreme behavior and poor control, not suicidal or homicidal, agrees to psych management  ROS Denies recent fever or chills. Denies sinus pressure, nasal congestion, ear pain or sore throat. Denies chest congestion, productive cough or wheezing. Denies chest pains, palpitations and leg swelling Denies abdominal pain, nausea, vomiting,diarrhea or constipation.   Denies dysuria, frequency, hesitancy or incontinence. Denies joint pain, swelling and limitation in mobility. Denies headaches, seizures, numbness, or tingling. Marland Kitchen   PE  BP 120/70   Pulse 96   Resp 15   Ht 5\' 3"  (1.6 m)   Wt 107 lb (48.5 kg)   LMP 11/30/2014   SpO2 96%   BMI 18.95 kg/m   Patient alert and oriented and in no cardiopulmonary distress.  HEENT: No facial asymmetry, EOMI,   oropharynx pink and moist.  Neck supple no JVD, no mass.  Chest: Clear to auscultation bilaterally.Decreased air entry bilaterally with wheezes and crackles  CVS: S1, S2 no murmurs, no S3.Regular rate.  ABD: Soft non tender.   Ext: No edema  MS: Adequate ROM spine, shoulders, hips and knees.  Skin: Intact, no ulcerations hyperpigmented macular rash on posterior left neck and low back Psych: Good eye contact, anxious , not depressed appearing CNS: CN 2-12 intact, power,  normal throughout.no focal deficits noted.   Assessment & Plan  Dermatitis PRURITIC  RASH ON LEFT POSTEROR NECK AND LOW BACK, THINKS BUSPAR IS THE CULPRIT, depo medrol IM followed by prednisone dose pack, stop buspar  Depression, major, single episode, severe ([place]) Not suicidal or homicidal, PHQ 9 score of 6 in 11/2018, improvement recorded , still eeds Psychiatry , screen likely represents under reporting. Increase Remeron dose and add lexapro in place of buspar  Anxiety and depression Npo controlled, recently taken to ED and ontolerant of Buspar, start lexapro and refer to Psych  Current smoker Asked:confirms currently smokes cigarettes 7/ day Assess: Unwilling to quit but cutting back Advise: needs to QUIT to reduce risk of cancer, cardio and cerebrovascular disease Assist: counseled for 5 minutes and literature provided Arrange: follow up in 3 months   '

## 2018-12-10 DIAGNOSIS — F17210 Nicotine dependence, cigarettes, uncomplicated: Secondary | ICD-10-CM | POA: Diagnosis not present

## 2018-12-10 DIAGNOSIS — Z716 Tobacco abuse counseling: Secondary | ICD-10-CM | POA: Diagnosis not present

## 2018-12-17 DIAGNOSIS — E063 Autoimmune thyroiditis: Secondary | ICD-10-CM | POA: Diagnosis not present

## 2018-12-17 DIAGNOSIS — E038 Other specified hypothyroidism: Secondary | ICD-10-CM | POA: Diagnosis not present

## 2018-12-17 LAB — TSH: TSH: 1.44 m[IU]/L (ref 0.40–4.50)

## 2018-12-17 LAB — T4, FREE: Free T4: 1.1 ng/dL (ref 0.8–1.8)

## 2018-12-24 ENCOUNTER — Encounter: Payer: Self-pay | Admitting: "Endocrinology

## 2018-12-24 ENCOUNTER — Ambulatory Visit (INDEPENDENT_AMBULATORY_CARE_PROVIDER_SITE_OTHER): Payer: BLUE CROSS/BLUE SHIELD | Admitting: "Endocrinology

## 2018-12-24 VITALS — BP 119/78 | HR 86 | Ht 63.0 in | Wt 103.0 lb

## 2018-12-24 DIAGNOSIS — E063 Autoimmune thyroiditis: Secondary | ICD-10-CM

## 2018-12-24 DIAGNOSIS — E038 Other specified hypothyroidism: Secondary | ICD-10-CM | POA: Diagnosis not present

## 2018-12-24 DIAGNOSIS — F17210 Nicotine dependence, cigarettes, uncomplicated: Secondary | ICD-10-CM | POA: Diagnosis not present

## 2018-12-24 DIAGNOSIS — Z716 Tobacco abuse counseling: Secondary | ICD-10-CM | POA: Diagnosis not present
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
## 2019-01-07 DIAGNOSIS — Z1331 Encounter for screening for depression: Secondary | ICD-10-CM | POA: Diagnosis not present

## 2019-01-07 DIAGNOSIS — Z008 Encounter for other general examination: Secondary | ICD-10-CM | POA: Diagnosis not present

## 2019-01-07 DIAGNOSIS — Z716 Tobacco abuse counseling: Secondary | ICD-10-CM | POA: Diagnosis not present

## 2019-01-07 DIAGNOSIS — F17210 Nicotine dependence, cigarettes, uncomplicated: Secondary | ICD-10-CM | POA: Diagnosis not present

## 2019-01-13 ENCOUNTER — Telehealth: Payer: Self-pay | Admitting: *Deleted

## 2019-01-13 MED ORDER — LEVOTHYROXINE SODIUM 25 MCG PO TABS: 37.5000 ug | ORAL_TABLET | Freq: Every day | ORAL | 3 refills | Status: DC

## 2019-01-13 NOTE — Telephone Encounter (Signed)
Patient called requesting more refills on her thyroid medicine, patient states her bottle says zero refills. Please advise

## 2019-03-09 ENCOUNTER — Encounter: Payer: BLUE CROSS/BLUE SHIELD | Admitting: Family Medicine

## 2019-03-22 ENCOUNTER — Other Ambulatory Visit: Payer: Self-pay

## 2019-03-22 ENCOUNTER — Encounter: Payer: Self-pay | Admitting: Family Medicine

## 2019-03-22 ENCOUNTER — Ambulatory Visit (INDEPENDENT_AMBULATORY_CARE_PROVIDER_SITE_OTHER): Payer: BLUE CROSS/BLUE SHIELD | Admitting: Family Medicine

## 2019-03-22 VITALS — BP 118/70 | HR 89 | Resp 12 | Ht 64.0 in | Wt 113.8 lb

## 2019-03-22 DIAGNOSIS — F17200 Nicotine dependence, unspecified, uncomplicated: Secondary | ICD-10-CM

## 2019-03-22 DIAGNOSIS — E785 Hyperlipidemia, unspecified: Secondary | ICD-10-CM | POA: Diagnosis not present

## 2019-03-22 DIAGNOSIS — Z0000 Encounter for general adult medical examination without abnormal findings: Secondary | ICD-10-CM | POA: Diagnosis not present

## 2019-03-22 DIAGNOSIS — Z1321 Encounter for screening for nutritional disorder: Secondary | ICD-10-CM

## 2019-03-22 DIAGNOSIS — Z23 Encounter for immunization: Secondary | ICD-10-CM | POA: Diagnosis not present

## 2019-03-22 MED ORDER — PANTOPRAZOLE SODIUM 20 MG PO TBEC: DELAYED_RELEASE_TABLET | ORAL | 1 refills | Status: DC

## 2019-03-22 MED ORDER — PAROXETINE HCL 10 MG PO TABS: 10.0000 mg | ORAL_TABLET | Freq: Every day | ORAL | 1 refills | Status: DC

## 2019-03-22 MED ORDER — ESCITALOPRAM OXALATE 5 MG PO TABS: 5.0000 mg | ORAL_TABLET | Freq: Every day | ORAL | 1 refills | Status: DC

## 2019-03-22 MED ORDER — CETIRIZINE HCL 10 MG PO TBDP: 1.0000 | ORAL_TABLET | Freq: Every day | ORAL | 1 refills | Status: DC

## 2019-03-22 MED ORDER — MIRTAZAPINE 15 MG PO TABS: 15.0000 mg | ORAL_TABLET | Freq: Every day | ORAL | 1 refills | Status: DC

## 2019-03-22 NOTE — Progress Notes (Signed)
    Carrie Perez     MRN: [id number]      DOB: [birth year]  HPI: Patient is in for annual physical exam. No other health concerns are expressed or addressed at the visit. Recent labs, if available are reviewed. Immunization is reviewed , and  updated   PE: BP 118/70   Pulse 89   Resp 12   Ht 5\' 4"  (1.626 m)   Wt 113 lb 12.8 oz (51.6 kg)   LMP 11/30/2014   SpO2 98%   BMI 19.53 kg/m   Pleasant  female, alert and oriented x 3, in no cardio-pulmonary distress. Afebrile. HEENT No facial trauma or asymetry. Sinuses non tender.  Extra occullar muscles intact, pupils equally reactive to light. External ears normal, tympanic membranes clear. Oropharynx moist, no exudate. Neck: supple, no adenopathy,JVD or thyromegaly.No bruits.  Chest: Clear to ascultation bilaterally.No crackles or wheezes. Non tender to palpation    Cardiovascular system; Heart sounds normal,  S1 and  S2 ,no S3.  No murmur, or thrill. Apical beat not displaced Peripheral pulses normal.  Abdomen: Soft, non tender, no organomegaly or masses. No bruits. Bowel sounds normal. No guarding, tenderness or rebound.  GU: No complaint, no exam indicated or performed    Musculoskeletal exam: Full ROM of spine, hips , shoulders and knees. No deformity ,swelling or crepitus noted. No muscle wasting or atrophy.   Neurologic: Cranial nerves 2 to 12 intact. Power, tone ,sensation and reflexes normal throughout. No disturbance in gait. No tremor.  Skin: Intact, no ulceration, erythema , scaling or rash noted. Allopecia totalis Pigmentation normal throughout  Psych; Normal mood and affect. Judgement and concentration normal   Assessment & Plan:  Annual physical exam Annual exam as documented.  Immunization and cancer screening needs are specifically addressed at this visit.   Current smoker Asked:confirms currently smokes cigarettes Assess: Unwilling to quit but cutting back    Advise: needs  to QUIT to reduce risk of cancer, cardio and cerebrovascular disease Assist: counseled for 5 minutes and literature provided Arrange: follow up in 3 months   Need for zoster vaccination After obtaining informed consent, the vaccine is  administered , with no adverse effect noted at the time of administration.

## 2019-03-22 NOTE — Patient Instructions (Addendum)
F/u in 5 months, call if you need me before, shingrix #2 at that visit  Shingrix # 1 today,  Work on stopping smoking, down to 3/ day, plan is to quit, use all resources you know of to help  Fasting lipid, cmp and  EGFR, vit D  This week  Thanks for choosing [facility], we consider it a privelige to serve you.   Reduced dose of lexapro 5 mg take every day  Protonix is reeuced to 20 mg daily  Continue regular  Exercise and cut back the nutty buddies!

## 2019-03-24 ENCOUNTER — Ambulatory Visit: Payer: BLUE CROSS/BLUE SHIELD | Admitting: Family Medicine

## 2019-03-28 ENCOUNTER — Encounter: Payer: Self-pay | Admitting: Family Medicine

## 2019-03-28 DIAGNOSIS — Z23 Encounter for immunization: Secondary | ICD-10-CM | POA: Insufficient documentation

## 2019-03-28 NOTE — Assessment & Plan Note (Signed)
Annual exam as documented. . Immunization and cancer screening needs are specifically addressed at this visit.  

## 2019-03-28 NOTE — Assessment & Plan Note (Signed)
After obtaining informed consent, the vaccine is  administered , with no adverse effect noted at the time of administration.  

## 2019-03-28 NOTE — Assessment & Plan Note (Signed)
Asked:confirms currently smokes cigarettes Assess: Unwilling to quit but cutting back Advise: needs to QUIT to reduce risk of cancer, cardio and cerebrovascular disease Assist: counseled for 5 minutes and literature provided Arrange: follow up in 3 months  

## 2019-04-22 ENCOUNTER — Other Ambulatory Visit (HOSPITAL_COMMUNITY): Payer: Self-pay | Admitting: Family Medicine

## 2019-04-22 DIAGNOSIS — Z1231 Encounter for screening mammogram for malignant neoplasm of breast: Secondary | ICD-10-CM

## 2019-04-26 ENCOUNTER — Ambulatory Visit (HOSPITAL_COMMUNITY)
Admission: RE | Admit: 2019-04-26 | Discharge: 2019-04-26 | Disposition: A | Discharge status: Home / Self Care | Payer: BC Managed Care – PPO | Source: Ambulatory Visit | Attending: Family Medicine | Admitting: Family Medicine

## 2019-04-26 ENCOUNTER — Other Ambulatory Visit: Payer: Self-pay

## 2019-04-26 DIAGNOSIS — Z1231 Encounter for screening mammogram for malignant neoplasm of breast: Secondary | ICD-10-CM | POA: Diagnosis not present

## 2019-04-27 ENCOUNTER — Other Ambulatory Visit (HOSPITAL_COMMUNITY): Payer: Self-pay | Admitting: Family Medicine

## 2019-04-27 DIAGNOSIS — R928 Other abnormal and inconclusive findings on diagnostic imaging of breast: Secondary | ICD-10-CM

## 2019-05-11 ENCOUNTER — Encounter (HOSPITAL_COMMUNITY): Payer: BC Managed Care – PPO

## 2019-05-11 ENCOUNTER — Ambulatory Visit (HOSPITAL_COMMUNITY): Payer: BC Managed Care – PPO

## 2019-05-25 ENCOUNTER — Ambulatory Visit (HOSPITAL_COMMUNITY): Payer: BC Managed Care – PPO

## 2019-05-25 ENCOUNTER — Other Ambulatory Visit: Payer: Self-pay

## 2019-05-25 ENCOUNTER — Ambulatory Visit (HOSPITAL_COMMUNITY)
Admission: RE | Admit: 2019-05-25 | Discharge: 2019-05-25 | Disposition: A | Discharge status: Home / Self Care | Payer: BC Managed Care – PPO | Source: Ambulatory Visit | Attending: Family Medicine | Admitting: Family Medicine

## 2019-05-25 DIAGNOSIS — R928 Other abnormal and inconclusive findings on diagnostic imaging of breast: Secondary | ICD-10-CM | POA: Diagnosis not present

## 2019-05-25 DIAGNOSIS — R922 Inconclusive mammogram: Secondary | ICD-10-CM | POA: Diagnosis not present

## 2019-06-28 ENCOUNTER — Ambulatory Visit: Payer: BLUE CROSS/BLUE SHIELD | Admitting: "Endocrinology

## 2019-06-28 DIAGNOSIS — E038 Other specified hypothyroidism: Secondary | ICD-10-CM | POA: Diagnosis not present

## 2019-06-28 DIAGNOSIS — E063 Autoimmune thyroiditis: Secondary | ICD-10-CM | POA: Diagnosis not present

## 2019-06-28 LAB — T4, FREE: Free T4: 0.9 ng/dL (ref 0.8–1.8)

## 2019-06-28 LAB — TSH: TSH: 5.04 mIU/L — ABNORMAL HIGH (ref 0.40–4.50)

## 2019-07-05 ENCOUNTER — Encounter: Payer: Self-pay | Admitting: "Endocrinology

## 2019-07-05 ENCOUNTER — Other Ambulatory Visit: Payer: Self-pay

## 2019-07-05 ENCOUNTER — Ambulatory Visit (INDEPENDENT_AMBULATORY_CARE_PROVIDER_SITE_OTHER): Payer: BC Managed Care – PPO | Admitting: "Endocrinology

## 2019-07-05 DIAGNOSIS — E038 Other specified hypothyroidism: Secondary | ICD-10-CM

## 2019-07-05 DIAGNOSIS — E063 Autoimmune thyroiditis: Secondary | ICD-10-CM

## 2019-07-05 MED ORDER — LEVOTHYROXINE SODIUM 50 MCG PO TABS: 50.0000 ug | ORAL_TABLET | Freq: Every day | ORAL | 3 refills | Status: DC
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
## 2019-08-23 ENCOUNTER — Encounter: Payer: Self-pay | Admitting: Family Medicine

## 2019-08-23 ENCOUNTER — Ambulatory Visit (INDEPENDENT_AMBULATORY_CARE_PROVIDER_SITE_OTHER): Payer: BC Managed Care – PPO | Admitting: Family Medicine

## 2019-08-23 ENCOUNTER — Other Ambulatory Visit: Payer: Self-pay

## 2019-08-23 VITALS — BP 108/76 | HR 70 | Temp 98.7°F | Ht 64.0 in | Wt 118.0 lb

## 2019-08-23 DIAGNOSIS — E039 Hypothyroidism, unspecified: Secondary | ICD-10-CM

## 2019-08-23 DIAGNOSIS — E559 Vitamin D deficiency, unspecified: Secondary | ICD-10-CM | POA: Diagnosis not present

## 2019-08-23 DIAGNOSIS — J309 Allergic rhinitis, unspecified: Secondary | ICD-10-CM

## 2019-08-23 DIAGNOSIS — F17200 Nicotine dependence, unspecified, uncomplicated: Secondary | ICD-10-CM

## 2019-08-23 DIAGNOSIS — E038 Other specified hypothyroidism: Secondary | ICD-10-CM

## 2019-08-23 DIAGNOSIS — E785 Hyperlipidemia, unspecified: Secondary | ICD-10-CM | POA: Diagnosis not present

## 2019-08-23 DIAGNOSIS — Z23 Encounter for immunization: Secondary | ICD-10-CM | POA: Diagnosis not present

## 2019-08-23 DIAGNOSIS — E063 Autoimmune thyroiditis: Secondary | ICD-10-CM

## 2019-08-23 NOTE — Assessment & Plan Note (Signed)
Asked:confirms currently smokes cigarettes Assess: Unwilling to quit but cutting back Advise: needs to QUIT to reduce risk of cancer, cardio and cerebrovascular disease Assist: counseled for 5 minutes and literature provided Arrange: follow up in 3 months  

## 2019-08-23 NOTE — Assessment & Plan Note (Signed)
Managed by Endo and controlled 

## 2019-08-23 NOTE — Patient Instructions (Addendum)
Physical exam with pap,  April 202`1, call if you need me before Flu vaccine today  Return for shingrix, nurse  Visit in 3 weeks  Please continue  To cut back on smoking , now at 3 / day, quit date approaching  Thankful all is well, and you are doing very well with your health  Fasting lipid, cmp and EGFr, and vit D  This week please  Thanks for choosing [facility], we consider it a privelige to serve you.

## 2019-08-23 NOTE — Progress Notes (Signed)
   Carrie Perez     MRN: [id number]      DOB: 1961/08/10   HPI Carrie Perez is here for follow up and re-evaluation of chronic medical conditions, medication management and review of any available recent lab and radiology data.  Preventive health is updated, specifically  Cancer screening and Immunization.   Has had a normal colonoscopy since last visit, also is very comfortable taking her thyroid medication. Denies anxiety or depression, and is gaining weight. The PT denies any adverse reactions to current medications since the last visit.  Working on smoking cessation through her job, now at 3/ day, unwilling to set a quit date   ROS Denies recent fever or chills. Denies sinus pressure, nasal congestion, ear pain or sore throat. Denies chest congestion, productive cough or wheezing. Denies chest pains, palpitations and leg swelling Denies abdominal pain, nausea, vomiting,diarrhea or constipation.   Denies dysuria, frequency, hesitancy or incontinence. Denies joint pain, swelling and limitation in mobility. Denies headaches, seizures, numbness, or tingling. Denies depression, anxiety or insomnia.No longer taking any medications Denies skin break down or rash.   PE  BP 108/76   Pulse 70   Temp 98.7 F (37.1 C) (Temporal)   Ht 5\' 4"  (1.626 m)   Wt 118 lb (53.5 kg)   LMP 11/30/2014   SpO2 97%   BMI 20.25 kg/m   Patient alert and oriented and in no cardiopulmonary distress.  HEENT: No facial asymmetry, EOMI,   oropharynx pink and moist.  Neck supple no JVD, no mass.  Chest: Clear to auscultation bilaterally.  CVS: S1, S2 no murmurs, no S3.Regular rate.  ABD: Soft non tender.   Ext: No edema  MS: Adequate ROM spine, shoulders, hips and knees.  Skin: Intact, no ulcerations or rash noted.  Psych: Good eye contact, normal affect. Memory intact not anxious or depressed appearing.  CNS: CN 2-12 intact, power,  normal throughout.no focal deficits noted.   Assessment  & Plan  Allergic rhinitis Controlled, no change in medication   Hyperlipemia Hyperlipidemia:Low fat diet discussed and encouraged.   Lipid Panel  Lab Results  Component Value Date   CHOL 242 (H) 12/11/2017   HDL 62 12/11/2017   LDLCALC 155 (H) 12/11/2017   TRIG 124 12/11/2017   CHOLHDL 3.9 12/11/2017   Updated lab needed at/ before next visit.     Current smoker Asked:confirms currently smokes cigarettes Assess: Unwilling to quit but cutting back Advise: needs to QUIT to reduce risk of cancer, cardio and cerebrovascular disease Assist: counseled for 5 minutes and literature provided Arrange: follow up in 3 months   Acquired hypothyroidism Managed by Endo and controlled

## 2019-08-23 NOTE — Assessment & Plan Note (Signed)
Hyperlipidemia:Low fat diet discussed and encouraged.   Lipid Panel  Lab Results  Component Value Date   CHOL 242 (H) 12/11/2017   HDL 62 12/11/2017   LDLCALC 155 (H) 12/11/2017   TRIG 124 12/11/2017   CHOLHDL 3.9 12/11/2017   Updated lab needed at/ before next visit.

## 2019-08-23 NOTE — Assessment & Plan Note (Signed)
Controlled, no change in medication  

## 2019-09-20 ENCOUNTER — Ambulatory Visit (INDEPENDENT_AMBULATORY_CARE_PROVIDER_SITE_OTHER): Payer: BC Managed Care – PPO

## 2019-09-20 ENCOUNTER — Other Ambulatory Visit: Payer: Self-pay

## 2019-09-20 DIAGNOSIS — Z23 Encounter for immunization: Secondary | ICD-10-CM | POA: Diagnosis not present

## 2019-10-28 DIAGNOSIS — Z716 Tobacco abuse counseling: Secondary | ICD-10-CM | POA: Diagnosis not present

## 2019-10-28 DIAGNOSIS — F17210 Nicotine dependence, cigarettes, uncomplicated: Secondary | ICD-10-CM | POA: Diagnosis not present

## 2019-10-29 ENCOUNTER — Other Ambulatory Visit: Payer: Self-pay | Admitting: "Endocrinology

## 2019-11-04 ENCOUNTER — Ambulatory Visit: Payer: BC Managed Care – PPO | Admitting: "Endocrinology

## 2019-11-08 DIAGNOSIS — E038 Other specified hypothyroidism: Secondary | ICD-10-CM | POA: Diagnosis not present

## 2019-11-08 DIAGNOSIS — E063 Autoimmune thyroiditis: Secondary | ICD-10-CM | POA: Diagnosis not present

## 2019-11-08 LAB — TSH: TSH: 0.12 mIU/L — ABNORMAL LOW (ref 0.40–4.50)

## 2019-11-08 LAB — T4, FREE: Free T4: 1.2 ng/dL (ref 0.8–1.8)

## 2019-11-17 ENCOUNTER — Encounter: Payer: Self-pay | Admitting: "Endocrinology

## 2019-11-17 ENCOUNTER — Ambulatory Visit (INDEPENDENT_AMBULATORY_CARE_PROVIDER_SITE_OTHER): Payer: BC Managed Care – PPO | Admitting: "Endocrinology

## 2019-11-17 DIAGNOSIS — E063 Autoimmune thyroiditis: Secondary | ICD-10-CM | POA: Diagnosis not present

## 2019-11-17 DIAGNOSIS — E038 Other specified hypothyroidism: Secondary | ICD-10-CM | POA: Diagnosis not present

## 2019-11-17 MED ORDER — LEVOTHYROXINE SODIUM 50 MCG PO TABS: ORAL_TABLET | ORAL | 3 refills | Status: DC
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
## 2019-12-13 ENCOUNTER — Other Ambulatory Visit: Payer: Self-pay | Admitting: Family Medicine

## 2020-01-11 ENCOUNTER — Other Ambulatory Visit: Payer: Self-pay

## 2020-01-11 ENCOUNTER — Ambulatory Visit (INDEPENDENT_AMBULATORY_CARE_PROVIDER_SITE_OTHER): Payer: BC Managed Care – PPO | Admitting: Family Medicine

## 2020-01-11 ENCOUNTER — Encounter: Payer: Self-pay | Admitting: Family Medicine

## 2020-01-11 VITALS — BP 104/70 | HR 79 | Temp 97.1°F | Resp 15 | Ht 64.0 in | Wt 119.8 lb

## 2020-01-11 DIAGNOSIS — F5104 Psychophysiologic insomnia: Secondary | ICD-10-CM

## 2020-01-11 DIAGNOSIS — Z23 Encounter for immunization: Secondary | ICD-10-CM | POA: Diagnosis not present

## 2020-01-11 DIAGNOSIS — F17210 Nicotine dependence, cigarettes, uncomplicated: Secondary | ICD-10-CM

## 2020-01-11 DIAGNOSIS — J309 Allergic rhinitis, unspecified: Secondary | ICD-10-CM

## 2020-01-11 DIAGNOSIS — E063 Autoimmune thyroiditis: Secondary | ICD-10-CM

## 2020-01-11 DIAGNOSIS — E038 Other specified hypothyroidism: Secondary | ICD-10-CM | POA: Diagnosis not present

## 2020-01-11 DIAGNOSIS — E785 Hyperlipidemia, unspecified: Secondary | ICD-10-CM | POA: Diagnosis not present

## 2020-01-11 DIAGNOSIS — F17200 Nicotine dependence, unspecified, uncomplicated: Secondary | ICD-10-CM

## 2020-01-11 DIAGNOSIS — E559 Vitamin D deficiency, unspecified: Secondary | ICD-10-CM

## 2020-01-11 DIAGNOSIS — L309 Dermatitis, unspecified: Secondary | ICD-10-CM

## 2020-01-11 MED ORDER — CLOTRIMAZOLE-BETAMETHASONE 1-0.05 % EX CREA: 1.0000 "application " | TOPICAL_CREAM | Freq: Two times a day (BID) | CUTANEOUS | 1 refills | Status: DC

## 2020-01-11 MED ORDER — MIRTAZAPINE 15 MG PO TABS: 15.0000 mg | ORAL_TABLET | Freq: Every day | ORAL | 5 refills | Status: DC

## 2020-01-11 MED ORDER — METHYLPREDNISOLONE ACETATE 80 MG/ML IJ SUSP
40.0000 mg | Freq: Once | INTRAMUSCULAR | Status: AC
  Administered 2020-01-11: 09:00:00 40 mg via INTRAMUSCULAR

## 2020-01-11 NOTE — Patient Instructions (Addendum)
Keep appointment in April as before, call if you need me sooner  Please schedule mammogram July 8 or after  Depo Medrol 40 mg IM in office today for allergy symptoms  TdAP today  Cream sent today for rash on left jaw  Now at 4 cigarettes/ day, NEED TO QUIT, please set a date, were smoking 5 to 6 last year, aim for 2 by April  Resume Remeron for sleep  Please get CBC, fasting lipid, cmp and eGFr, Vit D 1 week before April appointment

## 2020-01-14 ENCOUNTER — Encounter: Payer: Self-pay | Admitting: Family Medicine

## 2020-01-14 NOTE — Assessment & Plan Note (Signed)
Managed by Endo and controlled 

## 2020-01-14 NOTE — Assessment & Plan Note (Signed)
Sleep hygiene reviewed and written information offered also. Prescription sent for  medication needed.  

## 2020-01-14 NOTE — Assessment & Plan Note (Signed)
After obtaining informed consent, the vaccine is  administered , with no adverse effect noted at the time of administration.  

## 2020-01-14 NOTE — Progress Notes (Signed)
   Carrie Perez     MRN: [id number]      DOB: 1960/11/24   HPI Carrie Perez is here with a c/o itching on left jaw and ear , which has occurred in the past and has responded to steroids, requests same. Also has c/o chronic allergies which also benefit, runny nose and itchy eyes, she has no fevr or chills C/o poor sleep and wants to resume medication for this   ROS Denies recent fever or chills. Denies sinus pressure, nasal congestion, ear pain or sore throat. Denies chest congestion, productive cough or wheezing.nose , itchy eyes , denies fever or chills Denies chest pains, palpitations and leg swelling Denies abdominal pain, nausea, vomiting,diarrhea or constipation.   Denies dysuria, frequency, hesitancy or incontinence. Denies joint pain, swelling and limitation in mobility. Denies headaches, seizures, numbness, or tingling.    PE  BP 104/70   Pulse 79   Temp (!) 97.1 F (36.2 C) (Temporal)   Resp 15   Ht 5\' 4"  (1.626 m)   Wt 119 lb 12.8 oz (54.3 kg)   LMP 11/30/2014   SpO2 96%   BMI 20.56 kg/m   Patient alert and oriented and in no cardiopulmonary distress.  HEENT: No facial asymmetry, EOMI,     Neck supple .  Chest: Clear to auscultation bilaterally.  CVS: S1, S2 no murmurs, no S3.Regular rate.  ABD: Soft non tender.   Ext: No edema  MS: Adequate ROM spine, shoulders, hips and knees.  Skin: Intact, hyperpigmented maculopapular rash on left jaw up to ear, no erythema , skin breakdown or drainage  Psych: Good eye contact, normal affect. Memory intact not anxious or depressed appearing.  CNS: CN 2-12 intact, power,  normal throughout.no focal deficits noted.   Assessment & Plan  Dermatitis Depo medrol 80 mg IM administered, current flare affecting left jaw  Need for Tdap vaccination After obtaining informed consent, the vaccine is  administered , with no adverse effect noted at the time of administration.   Allergic rhinitis Controlled on oral  medication but slight exacerbation in recent times  Hypothyroidism due to Hashimoto's thyroiditis Managed by Endo and controlled  Hyperlipemia Hyperlipidemia:Low fat diet discussed and encouraged.   Lipid Panel  Lab Results  Component Value Date   CHOL 242 (H) 12/11/2017   HDL 62 12/11/2017   LDLCALC 155 (H) 12/11/2017   TRIG 124 12/11/2017   CHOLHDL 3.9 12/11/2017   Updated lab needed and is past due.     Current smoker Asked:confirms currently smokes 4 cigarettes/ day  Assess: Unwilling to quit but cutting back Advise: needs to QUIT to reduce risk of cancer, cardio and cerebrovascular disease Assist: counseled for 5 minutes and literature provided Arrange: follow up in 3 months   Insomnia Sleep hygiene reviewed and written information offered also. Prescription sent for  medication needed.

## 2020-01-14 NOTE — Assessment & Plan Note (Addendum)
Depo medrol 80 mg IM administered, current flare affecting left jaw

## 2020-01-14 NOTE — Assessment & Plan Note (Signed)
Hyperlipidemia:Low fat diet discussed and encouraged.   Lipid Panel  Lab Results  Component Value Date   CHOL 242 (H) 12/11/2017   HDL 62 12/11/2017   LDLCALC 155 (H) 12/11/2017   TRIG 124 12/11/2017   CHOLHDL 3.9 12/11/2017   Updated lab needed and is past due.

## 2020-01-14 NOTE — Assessment & Plan Note (Signed)
Asked:confirms currently smokes 4 cigarettes/ day  Assess: Unwilling to quit but cutting back Advise: needs to QUIT to reduce risk of cancer, cardio and cerebrovascular disease Assist: counseled for 5 minutes and literature provided Arrange: follow up in 3 months

## 2020-01-14 NOTE — Assessment & Plan Note (Signed)
Controlled on oral medication but slight exacerbation in recent times

## 2020-02-08 DIAGNOSIS — E559 Vitamin D deficiency, unspecified: Secondary | ICD-10-CM | POA: Diagnosis not present

## 2020-02-08 DIAGNOSIS — E785 Hyperlipidemia, unspecified: Secondary | ICD-10-CM | POA: Diagnosis not present

## 2020-02-08 DIAGNOSIS — E063 Autoimmune thyroiditis: Secondary | ICD-10-CM | POA: Diagnosis not present

## 2020-02-08 DIAGNOSIS — E038 Other specified hypothyroidism: Secondary | ICD-10-CM | POA: Diagnosis not present

## 2020-02-09 ENCOUNTER — Encounter: Payer: Self-pay | Admitting: Family Medicine

## 2020-02-09 LAB — LIPID PANEL
Cholesterol: 235 mg/dL — ABNORMAL HIGH (ref <200)
HDL: 57 mg/dL (ref >=50)
LDL Cholesterol (Calc): 160 mg/dL (calc) — ABNORMAL HIGH
Non-HDL Cholesterol (Calc): 178 mg/dL (calc) — ABNORMAL HIGH (ref <130)
Total CHOL/HDL Ratio: 4.1 (calc) (ref <5.0)
Triglycerides: 78 mg/dL (ref <150)

## 2020-02-09 LAB — COMPLETE METABOLIC PANEL WITH GFR
AG Ratio: 1.5 (calc) (ref 1.0–2.5)
ALT: 8 U/L (ref 6–29)
AST: 15 U/L (ref 10–35)
Albumin: 4.4 g/dL (ref 3.6–5.1)
Alkaline phosphatase (APISO): 80 U/L (ref 37–153)
BUN: 14 mg/dL (ref 7–25)
CO2: 31 mmol/L (ref 20–32)
Calcium: 10 mg/dL (ref 8.6–10.4)
Chloride: 103 mmol/L (ref 98–110)
Creat: 0.86 mg/dL (ref 0.50–1.05)
GFR, Est African American: 87 mL/min/{1.73_m2} (ref >=60)
GFR, Est Non African American: 75 mL/min/{1.73_m2} (ref >=60)
Globulin: 3 g/dL (calc) (ref 1.9–3.7)
Glucose, Bld: 101 mg/dL — ABNORMAL HIGH (ref 65–99)
Potassium: 3.9 mmol/L (ref 3.5–5.3)
Sodium: 140 mmol/L (ref 135–146)
Total Bilirubin: 0.4 mg/dL (ref 0.2–1.2)
Total Protein: 7.4 g/dL (ref 6.1–8.1)

## 2020-02-09 LAB — CBC
HCT: 38.2 % (ref 35.0–45.0)
Hemoglobin: 12.6 g/dL (ref 11.7–15.5)
MCH: 29.2 pg (ref 27.0–33.0)
MCHC: 33 g/dL (ref 32.0–36.0)
MCV: 88.4 fL (ref 80.0–100.0)
MPV: 11.5 fL (ref 7.5–12.5)
Platelets: 231 10*3/uL (ref 140–400)
RBC: 4.32 10*6/uL (ref 3.80–5.10)
RDW: 13.5 % (ref 11.0–15.0)
WBC: 4.4 10*3/uL (ref 3.8–10.8)

## 2020-02-09 LAB — TSH: TSH: 4.17 mIU/L (ref 0.40–4.50)

## 2020-02-09 LAB — T4, FREE: Free T4: 0.9 ng/dL (ref 0.8–1.8)

## 2020-02-09 LAB — VITAMIN D 25 HYDROXY (VIT D DEFICIENCY, FRACTURES): Vit D, 25-Hydroxy: 29 ng/mL — ABNORMAL LOW (ref 30–100)

## 2020-02-16 ENCOUNTER — Ambulatory Visit: Payer: BC Managed Care – PPO | Admitting: "Endocrinology

## 2020-02-21 ENCOUNTER — Other Ambulatory Visit: Payer: Self-pay

## 2020-02-21 ENCOUNTER — Encounter: Payer: Self-pay | Admitting: "Endocrinology

## 2020-02-21 ENCOUNTER — Ambulatory Visit (INDEPENDENT_AMBULATORY_CARE_PROVIDER_SITE_OTHER): Payer: BC Managed Care – PPO | Admitting: "Endocrinology

## 2020-02-21 VITALS — BP 109/73 | HR 55 | Ht 64.0 in | Wt 118.2 lb

## 2020-02-21 DIAGNOSIS — E063 Autoimmune thyroiditis: Secondary | ICD-10-CM

## 2020-02-21 DIAGNOSIS — E038 Other specified hypothyroidism: Secondary | ICD-10-CM | POA: Diagnosis not present

## 2020-02-21 MED ORDER — LEVOTHYROXINE SODIUM 125 MCG PO TABS: 62.5000 ug | ORAL_TABLET | Freq: Every day | ORAL | 1 refills | Status: DC
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
## 2020-02-23 ENCOUNTER — Telehealth: Payer: Self-pay | Admitting: "Endocrinology

## 2020-02-23 NOTE — Telephone Encounter (Signed)
Pt left a VM that she thought she was suppose to break the levothyroxine (SYNTHROID) 125 MCG tablet pill in half. The bottle says take one tablet. Please advise

## 2020-02-23 NOTE — Telephone Encounter (Signed)
Left a message requesting a return call to the office. 

## 2020-02-23 NOTE — Telephone Encounter (Signed)
Yes, she has to break the 125 mcg pill in half to get 62.5 mcg po qam.

## 2020-02-24 NOTE — Telephone Encounter (Signed)
Discussed with pt, understanding voiced per pt. 

## 2020-02-28 ENCOUNTER — Encounter: Payer: BC Managed Care – PPO | Admitting: Family Medicine

## 2020-03-08 ENCOUNTER — Telehealth: Payer: Self-pay | Admitting: "Endocrinology

## 2020-03-08 NOTE — Telephone Encounter (Signed)
Pt.notified

## 2020-03-08 NOTE — Telephone Encounter (Signed)
Pt left a VM asking if it is okay if she takes Biotin Vitamins, hair skin and nails. Please advise

## 2020-03-08 NOTE — Telephone Encounter (Signed)
She can try it. But she has to stop it 2 weeks before her next thyroid labs.

## 2020-03-23 ENCOUNTER — Other Ambulatory Visit: Payer: Self-pay

## 2020-03-23 MED ORDER — LEVOTHYROXINE SODIUM 125 MCG PO TABS: 62.5000 ug | ORAL_TABLET | Freq: Every day | ORAL | 1 refills | Status: DC

## 2020-04-01 ENCOUNTER — Other Ambulatory Visit: Payer: Self-pay | Admitting: Family Medicine

## 2020-07-17 ENCOUNTER — Other Ambulatory Visit (HOSPITAL_COMMUNITY): Payer: Self-pay | Admitting: Family Medicine

## 2020-07-17 DIAGNOSIS — Z1231 Encounter for screening mammogram for malignant neoplasm of breast: Secondary | ICD-10-CM

## 2020-07-27 ENCOUNTER — Ambulatory Visit (HOSPITAL_COMMUNITY)
Admission: RE | Admit: 2020-07-27 | Discharge: 2020-07-27 | Disposition: A | Discharge status: Home / Self Care | Payer: BC Managed Care – PPO | Source: Ambulatory Visit | Attending: Family Medicine | Admitting: Family Medicine

## 2020-07-27 ENCOUNTER — Other Ambulatory Visit: Payer: Self-pay

## 2020-07-27 DIAGNOSIS — Z1231 Encounter for screening mammogram for malignant neoplasm of breast: Secondary | ICD-10-CM | POA: Diagnosis not present

## 2020-07-28 ENCOUNTER — Ambulatory Visit (HOSPITAL_COMMUNITY): Payer: BC Managed Care – PPO

## 2020-08-04 ENCOUNTER — Other Ambulatory Visit: Payer: Self-pay

## 2020-08-04 ENCOUNTER — Ambulatory Visit (INDEPENDENT_AMBULATORY_CARE_PROVIDER_SITE_OTHER): Payer: BC Managed Care – PPO

## 2020-08-04 DIAGNOSIS — Z23 Encounter for immunization: Secondary | ICD-10-CM | POA: Diagnosis not present

## 2020-08-08 ENCOUNTER — Other Ambulatory Visit: Payer: Self-pay

## 2020-08-08 ENCOUNTER — Other Ambulatory Visit (HOSPITAL_COMMUNITY)
Admission: RE | Admit: 2020-08-08 | Discharge: 2020-08-08 | Disposition: A | Discharge status: Home / Self Care | Payer: BC Managed Care – PPO | Source: Ambulatory Visit | Attending: Family Medicine | Admitting: Family Medicine

## 2020-08-08 ENCOUNTER — Encounter: Payer: Self-pay | Admitting: Family Medicine

## 2020-08-08 ENCOUNTER — Ambulatory Visit (INDEPENDENT_AMBULATORY_CARE_PROVIDER_SITE_OTHER): Payer: BC Managed Care – PPO | Admitting: Family Medicine

## 2020-08-08 VITALS — BP 121/71 | HR 64 | Resp 16 | Ht 64.0 in | Wt 113.0 lb

## 2020-08-08 DIAGNOSIS — Z0000 Encounter for general adult medical examination without abnormal findings: Secondary | ICD-10-CM | POA: Diagnosis not present

## 2020-08-08 DIAGNOSIS — E559 Vitamin D deficiency, unspecified: Secondary | ICD-10-CM

## 2020-08-08 DIAGNOSIS — Z124 Encounter for screening for malignant neoplasm of cervix: Secondary | ICD-10-CM | POA: Insufficient documentation

## 2020-08-08 DIAGNOSIS — F17200 Nicotine dependence, unspecified, uncomplicated: Secondary | ICD-10-CM | POA: Diagnosis not present

## 2020-08-08 DIAGNOSIS — E785 Hyperlipidemia, unspecified: Secondary | ICD-10-CM

## 2020-08-08 DIAGNOSIS — E039 Hypothyroidism, unspecified: Secondary | ICD-10-CM

## 2020-08-08 MED ORDER — MIRTAZAPINE 15 MG PO TABS: 15.0000 mg | ORAL_TABLET | Freq: Every day | ORAL | 5 refills | Status: DC

## 2020-08-08 NOTE — Patient Instructions (Addendum)
F/U in office with MD in March, call if you need me before  Please cut back cigarettes to 5/day by December, then 3/day in the New Year  Pap today  Satrt calcium 500 mg  with D3 , 400 IU  one tablet twice daily (OTC)  Fasting liupid, cmp and eGFr, cBC and vit D end February   Steps to Quit Smoking Smoking tobacco is the leading cause of preventable death. It can affect almost every organ in the body. Smoking puts you and people around you at risk for many serious, long-lasting (chronic) diseases. Quitting smoking can be hard, but it is one of the best things that you can do for your health. It is never too late to quit. How do I get ready to quit? When you decide to quit smoking, make a plan to help you succeed. Before you quit:  Pick a date to quit. Set a date within the next 2 weeks to give you time to prepare.  Write down the reasons why you are quitting. Keep this list in places where you will see it often.  Tell your family, friends, and co-workers that you are quitting. Their support is important.  Talk with your doctor about the choices that may help you quit.  Find out if your health insurance will pay for these treatments.  Know the people, places, things, and activities that make you want to smoke (triggers). Avoid them. What first steps can I take to quit smoking?  Throw away all cigarettes at home, at work, and in your car.  Throw away the things that you use when you smoke, such as ashtrays and lighters.  Clean your car. Make sure to empty the ashtray.  Clean your home, including curtains and carpets. What can I do to help me quit smoking? Talk with your doctor about taking medicines and seeing a counselor at the same time. You are more likely to succeed when you do both.  If you are pregnant or breastfeeding, talk with your doctor about counseling or other ways to quit smoking. Do not take medicine to help you quit smoking unless your doctor tells you to do  so. To quit smoking: Quit right away  Quit smoking totally, instead of slowly cutting back on how much you smoke over a period of time.  Go to counseling. You are more likely to quit if you go to counseling sessions regularly. Take medicine You may take medicines to help you quit. Some medicines need a prescription, and some you can buy over-the-counter. Some medicines may contain a drug called nicotine to replace the nicotine in cigarettes. Medicines may:  Help you to stop having the desire to smoke (cravings).  Help to stop the problems that come when you stop smoking (withdrawal symptoms). Your doctor may ask you to use:  Nicotine patches, gum, or lozenges.  Nicotine inhalers or sprays.  Non-nicotine medicine that is taken by mouth. Find resources Find resources and other ways to help you quit smoking and remain smoke-free after you quit. These resources are most helpful when you use them often. They include:  Online chats with a Social worker.  Phone quitlines.  Printed Furniture conservator/restorer.  Support groups or group counseling.  Text messaging programs.  Mobile phone apps. Use apps on your mobile phone or tablet that can help you stick to your quit plan. There are many free apps for mobile phones and tablets as well as websites. Examples include Quit Guide from the [employer] and smokefree.gov  What things can I do to make it easier to quit?   Talk to your family and friends. Ask them to support and encourage you.  Call a phone quitline (1-800-QUIT-NOW), reach out to support groups, or work with a Social worker.  Ask people who smoke to not smoke around you.  Avoid places that make you want to smoke, such as: ? Bars. ? Parties. ? Smoke-break areas at work.  Spend time with people who do not smoke.  Lower the stress in your life. Stress can make you want to smoke. Try these things to help your stress: ? Getting regular exercise. ? Doing deep-breathing exercises. ? Doing  yoga. ? Meditating. ? Doing a body scan. To do this, close your eyes, focus on one area of your body at a time from head to toe. Notice which parts of your body are tense. Try to relax the muscles in those areas. How will I feel when I quit smoking? Day 1 to 3 weeks Within the first 24 hours, you may start to have some problems that come from quitting tobacco. These problems are very bad 2-3 days after you quit, but they do not often last for more than 2-3 weeks. You may get these symptoms:  Mood swings.  Feeling restless, nervous, angry, or annoyed.  Trouble concentrating.  Dizziness.  Strong desire for high-sugar foods and nicotine.  Weight gain.  Trouble pooping (constipation).  Feeling like you may vomit (nausea).  Coughing or a sore throat.  Changes in how the medicines that you take for other issues work in your body.  Depression.  Trouble sleeping (insomnia). Week 3 and afterward After the first 2-3 weeks of quitting, you may start to notice more positive results, such as:  Better sense of smell and taste.  Less coughing and sore throat.  Slower heart rate.  Lower blood pressure.  Clearer skin.  Better breathing.  Fewer sick days. Quitting smoking can be hard. Do not give up if you fail the first time. Some people need to try a few times before they succeed. Do your best to stick to your quit plan, and talk with your doctor if you have any questions or concerns. Summary  Smoking tobacco is the leading cause of preventable death. Quitting smoking can be hard, but it is one of the best things that you can do for your health.  When you decide to quit smoking, make a plan to help you succeed.  Quit smoking right away, not slowly over a period of time.  When you start quitting, seek help from your doctor, family, or friends. This information is not intended to replace advice given to you by your health care provider. Make sure you discuss any questions you  have with your health care provider. Document Revised: 07/30/2019 Document Reviewed: 01/23/2019 Elsevier Patient Education  [employer].

## 2020-08-08 NOTE — Assessment & Plan Note (Signed)
Asked:confirms currently smokes cigarettes 7/day Assess: Unwilling to set a quit date, but is cutting back Advise: needs to QUIT to reduce risk of cancer, cardio and cerebrovascular disease Assist: counseled for 5 minutes and literature provided Arrange: follow up in 2 to 4 months  

## 2020-08-08 NOTE — Assessment & Plan Note (Signed)

## 2020-08-08 NOTE — Progress Notes (Signed)
    Carrie Perez     MRN: [id number]      DOB: 1961/06/17  HPI: Patient is in for annual physical exam. No other health concerns are expressed or addressed at the visit. Recent labs, if available are reviewed. Immunization is reviewed , and  updated if needed.   PE: BP 121/71   Pulse 64   Resp 16   Ht 5\' 4"  (1.626 m)   Wt 113 lb (51.3 kg)   LMP 11/30/2014   SpO2 98%   BMI 19.40 kg/m   Pleasant  female, alert and oriented x 3, in no cardio-pulmonary distress. Afebrile. HEENT No facial trauma or asymetry. Sinuses non tender.  Extra occullar muscles intact.. External ears normal, . Neck: supple, no adenopathy,JVD or thyromegaly.No bruits.  Chest: Clear to ascultation bilaterally.No crackles or wheezes. Non tender to palpation  Breast: No asymetry,no masses or lumps. No tenderness. No nipple discharge or inversion. No axillary or supraclavicular adenopathy  Cardiovascular system; Heart sounds normal,  S1 and  S2 ,no S3.  No murmur, or thrill. Apical beat not displaced Peripheral pulses normal.  Abdomen: Soft, non tender, no organomegaly or masses. No bruits. Bowel sounds normal. No guarding, tenderness or rebound.   GU: External genitalia normal female genitalia , normal female distribution of hair. No lesions. Urethral meatus normal in size, no  Prolapse, no lesions visibly  Present. Bladder non tender. Vagina pink and moist , with no visible lesions , discharge present . Adequate pelvic support no  cystocele or rectocele noted Cervix pink and appears healthy, no lesions or ulcerations noted, no discharge noted from os Uterus normal size, no adnexal masses, no cervical motion or adnexal tenderness.   Musculoskeletal exam: Full ROM of spine, hips , shoulders and knees. No deformity ,swelling or crepitus noted. No muscle wasting or atrophy.   Neurologic: Cranial nerves 2 to 12 intact. Power, tone ,sensation and reflexes normal throughout. No  disturbance in gait. No tremor.  Skin: Intact, no ulceration, erythema , scaling or rash noted. Pigmentation normal throughout  Psych; Normal mood and affect. Judgement and concentration normal   Assessment & Plan:  Annual physical exam Annual exam as documented. Counseling done  re healthy lifestyle involving commitment to 150 minutes exercise per week, heart healthy diet, and attaining healthy weight.The importance of adequate sleep also discussed. Regular seat belt use and home safety, is also discussed. Changes in health habits are decided on by the patient with goals and time frames  set for achieving them. Immunization and cancer screening needs are specifically addressed at this visit.   Current smoker Asked:confirms currently smokes cigarettes 7/day Assess: Unwilling to set a quit date, but is cutting back Advise: needs to QUIT to reduce risk of cancer, cardio and cerebrovascular disease Assist: counseled for 5 minutes and literature provided Arrange: follow up in 2 to 4 months

## 2020-08-11 ENCOUNTER — Encounter: Payer: Self-pay | Admitting: Family Medicine

## 2020-08-11 LAB — CYTOLOGY - PAP
Comment: NEGATIVE
Diagnosis: NEGATIVE
High risk HPV: NEGATIVE

## 2020-08-14 DIAGNOSIS — E038 Other specified hypothyroidism: Secondary | ICD-10-CM | POA: Diagnosis not present

## 2020-08-14 DIAGNOSIS — E063 Autoimmune thyroiditis: Secondary | ICD-10-CM | POA: Diagnosis not present

## 2020-08-14 LAB — TSH: TSH: 0.92 mIU/L (ref 0.40–4.50)

## 2020-08-14 LAB — T4, FREE: Free T4: 1.3 ng/dL (ref 0.8–1.8)

## 2020-08-22 ENCOUNTER — Encounter: Payer: Self-pay | Admitting: "Endocrinology

## 2020-08-22 ENCOUNTER — Telehealth (INDEPENDENT_AMBULATORY_CARE_PROVIDER_SITE_OTHER): Payer: BC Managed Care – PPO | Admitting: "Endocrinology

## 2020-08-22 DIAGNOSIS — E038 Other specified hypothyroidism: Secondary | ICD-10-CM | POA: Diagnosis not present

## 2020-08-22 DIAGNOSIS — E063 Autoimmune thyroiditis: Secondary | ICD-10-CM

## 2020-08-22 MED ORDER — LEVOTHYROXINE SODIUM 125 MCG PO TABS: 62.5000 ug | ORAL_TABLET | Freq: Every day | ORAL | 1 refills | Status: DC
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
## 2020-08-23 ENCOUNTER — Other Ambulatory Visit: Payer: Self-pay

## 2020-08-23 DIAGNOSIS — E063 Autoimmune thyroiditis: Secondary | ICD-10-CM

## 2020-08-23 DIAGNOSIS — E038 Other specified hypothyroidism: Secondary | ICD-10-CM

## 2020-08-23 MED ORDER — LEVOTHYROXINE SODIUM 125 MCG PO TABS: 62.5000 ug | ORAL_TABLET | Freq: Every day | ORAL | 1 refills | Status: DC

## 2020-11-30 ENCOUNTER — Ambulatory Visit (INDEPENDENT_AMBULATORY_CARE_PROVIDER_SITE_OTHER): Payer: BC Managed Care – PPO

## 2020-11-30 ENCOUNTER — Other Ambulatory Visit: Payer: Self-pay

## 2020-11-30 ENCOUNTER — Encounter: Payer: Self-pay | Admitting: Family Medicine

## 2020-11-30 ENCOUNTER — Telehealth: Payer: Self-pay

## 2020-11-30 ENCOUNTER — Telehealth (INDEPENDENT_AMBULATORY_CARE_PROVIDER_SITE_OTHER): Payer: BC Managed Care – PPO | Admitting: Family Medicine

## 2020-11-30 VITALS — Ht 63.0 in | Wt 112.6 lb

## 2020-11-30 DIAGNOSIS — J3089 Other allergic rhinitis: Secondary | ICD-10-CM

## 2020-11-30 DIAGNOSIS — L239 Allergic contact dermatitis, unspecified cause: Secondary | ICD-10-CM | POA: Diagnosis not present

## 2020-11-30 DIAGNOSIS — J309 Allergic rhinitis, unspecified: Secondary | ICD-10-CM | POA: Diagnosis not present

## 2020-11-30 MED ORDER — PREDNISONE 5 MG PO TABS: 5.0000 mg | ORAL_TABLET | Freq: Two times a day (BID) | ORAL | 0 refills | Status: AC

## 2020-11-30 MED ORDER — METHYLPREDNISOLONE ACETATE 80 MG/ML IJ SUSP
80.0000 mg | Freq: Once | INTRAMUSCULAR | Status: AC
  Administered 2020-11-30: 80 mg via INTRAMUSCULAR

## 2020-11-30 NOTE — Assessment & Plan Note (Signed)
Current flare with itchy rash behind ears reported over past several days.  IM steroid followed by short course of oral steroid

## 2020-11-30 NOTE — Progress Notes (Signed)
Virtual Visit via Telephone Note  I connected with Carrie Perez on 11/30/20 at 10:20 AM EST by telephone and verified that I am speaking with the correct person using two identifiers.  Location: Patient:home  Provider: work   I discussed the limitations, risks, security and privacy concerns of performing an evaluation and management service by telephone and the availability of in person appointments. I also discussed with the patient that there may be a patient responsible charge related to this service. The patient expressed understanding and agreed to proceed.   History of Present Illness: 4 day h/o itching behind ears and scratchy throat, no fever, chills, nasal drainage or cough. Typical allergy flare which responds to steroids. No other complaint or concern voiced Denies recent fever or chills. Denies sinus pressure, nasal congestion, ear pain or sore throat. Denies chest congestion, productive cough or wheezing.      Observations/Objective: Ht 5\' 3"  (1.6 m)   Wt 112 lb 9.6 oz (51.1 kg)   LMP 11/30/2014   BMI 19.95 kg/m  Good communication with no confusion and intact memory. Alert and oriented x 3 No signs of respiratory distress during speech   Assessment and Plan: Allergic rhinitis 3 day h/o flare with increased post nasal drainage and itchy throat, and skin, depo medrol 80 mg IM and short oral steroid course  Allergic dermatitis Current flare with itchy rash behind ears reported over past several days.  IM steroid followed by short course of oral steroid    Follow Up Instructions:    I discussed the assessment and treatment plan with the patient. The patient was provided an opportunity to ask questions and all were answered. The patient agreed with the plan and demonstrated an understanding of the instructions.   The patient was advised to call back or seek an in-person evaluation if the symptoms worsen or if the condition fails to improve as  anticipated.  I provided 12 minutes of non-face-to-face time during this encounter.   Tula Nakayama, MD

## 2020-11-30 NOTE — Assessment & Plan Note (Signed)
3 day h/o flare with increased post nasal drainage and itchy throat, and skin, depo medrol 80 mg IM and short oral steroid course

## 2020-11-30 NOTE — Telephone Encounter (Signed)
She has an appt with Dr Moshe Cipro this am

## 2020-11-30 NOTE — Patient Instructions (Addendum)
Follow-up as before call if you need me sooner.  Please arrange with the nurse to come into the office today to get Depo-Medrol 80 mg IM for uncontrolled allergies.  Prednisone is prescribed for 5 days also you can collect this today.  Thanks for choosing [facility], we consider it a privelige to serve you.   Yo need to stop smoking for your health, please continue to work on making this a reality. You may  Call [id number]  24/7 for elp with stopping smoking    Steps to Quit Smoking Smoking tobacco is the leading cause of preventable death. It can affect almost every organ in the body. Smoking puts you and people around you at risk for many serious, long-lasting (chronic) diseases. Quitting smoking can be hard, but it is one of the best things that you can do for your health. It is never too late to quit. How do I get ready to quit? When you decide to quit smoking, make a plan to help you succeed. Before you quit:  Pick a date to quit. Set a date within the next 2 weeks to give you time to prepare.  Write down the reasons why you are quitting. Keep this list in places where you will see it often.  Tell your family, friends, and co-workers that you are quitting. Their support is important.  Talk with your doctor about the choices that may help you quit.  Find out if your health insurance will pay for these treatments.  Know the people, places, things, and activities that make you want to smoke (triggers). Avoid them. What first steps can I take to quit smoking?  Throw away all cigarettes at home, at work, and in your car.  Throw away the things that you use when you smoke, such as ashtrays and lighters.  Clean your car. Make sure to empty the ashtray.  Clean your home, including curtains and carpets. What can I do to help me quit smoking? Talk with your doctor about taking medicines and seeing a counselor at the same time. You are more likely to succeed when you do  both.  If you are pregnant or breastfeeding, talk with your doctor about counseling or other ways to quit smoking. Do not take medicine to help you quit smoking unless your doctor tells you to do so. To quit smoking: Quit right away  Quit smoking totally, instead of slowly cutting back on how much you smoke over a period of time.  Go to counseling. You are more likely to quit if you go to counseling sessions regularly. Take medicine You may take medicines to help you quit. Some medicines need a prescription, and some you can buy over-the-counter. Some medicines may contain a drug called nicotine to replace the nicotine in cigarettes. Medicines may:  Help you to stop having the desire to smoke (cravings).  Help to stop the problems that come when you stop smoking (withdrawal symptoms). Your doctor may ask you to use:  Nicotine patches, gum, or lozenges.  Nicotine inhalers or sprays.  Non-nicotine medicine that is taken by mouth. Find resources Find resources and other ways to help you quit smoking and remain smoke-free after you quit. These resources are most helpful when you use them often. They include:  Online chats with a Social worker.  Phone quitlines.  Printed Furniture conservator/restorer.  Support groups or group counseling.  Text messaging programs.  Mobile phone apps. Use apps on your mobile phone or tablet that can  help you stick to your quit plan. There are many free apps for mobile phones and tablets as well as websites. Examples include Quit Guide from the [employer] and smokefree.gov   What things can I do to make it easier to quit?  Talk to your family and friends. Ask them to support and encourage you.  Call a phone quitline (1-800-QUIT-NOW), reach out to support groups, or work with a Social worker.  Ask people who smoke to not smoke around you.  Avoid places that make you want to smoke, such as: ? Bars. ? Parties. ? Smoke-break areas at work.  Spend time with people who do  not smoke.  Lower the stress in your life. Stress can make you want to smoke. Try these things to help your stress: ? Getting regular exercise. ? Doing deep-breathing exercises. ? Doing yoga. ? Meditating. ? Doing a body scan. To do this, close your eyes, focus on one area of your body at a time from head to toe. Notice which parts of your body are tense. Try to relax the muscles in those areas.   How will I feel when I quit smoking? Day 1 to 3 weeks Within the first 24 hours, you may start to have some problems that come from quitting tobacco. These problems are very bad 2-3 days after you quit, but they do not often last for more than 2-3 weeks. You may get these symptoms:  Mood swings.  Feeling restless, nervous, angry, or annoyed.  Trouble concentrating.  Dizziness.  Strong desire for high-sugar foods and nicotine.  Weight gain.  Trouble pooping (constipation).  Feeling like you may vomit (nausea).  Coughing or a sore throat.  Changes in how the medicines that you take for other issues work in your body.  Depression.  Trouble sleeping (insomnia). Week 3 and afterward After the first 2-3 weeks of quitting, you may start to notice more positive results, such as:  Better sense of smell and taste.  Less coughing and sore throat.  Slower heart rate.  Lower blood pressure.  Clearer skin.  Better breathing.  Fewer sick days. Quitting smoking can be hard. Do not give up if you fail the first time. Some people need to try a few times before they succeed. Do your best to stick to your quit plan, and talk with your doctor if you have any questions or concerns. Summary  Smoking tobacco is the leading cause of preventable death. Quitting smoking can be hard, but it is one of the best things that you can do for your health.  When you decide to quit smoking, make a plan to help you succeed.  Quit smoking right away, not slowly over a period of time.  When you start  quitting, seek help from your doctor, family, or friends. This information is not intended to replace advice given to you by your health care provider. Make sure you discuss any questions you have with your health care provider. Document Revised: 07/30/2019 Document Reviewed: 01/23/2019 Elsevier Patient Education  [place].

## 2020-11-30 NOTE — Telephone Encounter (Signed)
Patient called she states she normally comes into the office twice a year to get a shot for allergies but has been unable to do so recently she is requesting a zpack for allergies be sent to [facility] on [address] and call her when this has been sent p# [phone]

## 2020-12-14 ENCOUNTER — Telehealth: Payer: Self-pay | Admitting: Family Medicine

## 2020-12-14 NOTE — Telephone Encounter (Signed)
Please call pt and give her an asap appointment for depression and anxiety preferably with me, video is fine

## 2020-12-14 NOTE — Telephone Encounter (Signed)
Pt made an appt Tuesday with Dr Moshe Cipro

## 2020-12-19 ENCOUNTER — Telehealth (INDEPENDENT_AMBULATORY_CARE_PROVIDER_SITE_OTHER): Payer: BC Managed Care – PPO | Admitting: Family Medicine

## 2020-12-19 ENCOUNTER — Telehealth: Payer: Self-pay

## 2020-12-19 ENCOUNTER — Encounter: Payer: Self-pay | Admitting: Family Medicine

## 2020-12-19 ENCOUNTER — Other Ambulatory Visit: Payer: Self-pay

## 2020-12-19 VITALS — Ht 63.0 in | Wt 112.4 lb

## 2020-12-19 DIAGNOSIS — F419 Anxiety disorder, unspecified: Secondary | ICD-10-CM | POA: Diagnosis not present

## 2020-12-19 DIAGNOSIS — F17200 Nicotine dependence, unspecified, uncomplicated: Secondary | ICD-10-CM | POA: Diagnosis not present

## 2020-12-19 DIAGNOSIS — F5101 Primary insomnia: Secondary | ICD-10-CM

## 2020-12-19 MED ORDER — ALPRAZOLAM 0.25 MG PO TABS: ORAL_TABLET | ORAL | 1 refills | Status: DC

## 2020-12-19 MED ORDER — ESCITALOPRAM OXALATE 10 MG PO TABS: 10.0000 mg | ORAL_TABLET | Freq: Every day | ORAL | 5 refills | Status: DC

## 2020-12-19 NOTE — Patient Instructions (Addendum)
Change f/u to early April, cancel March, goa is nicotine free at that time  New for sleep is lexapro daily  Continue remeron as before, note after visit pt called to state she will not fill the lexapro and xanax was prescribed   Steps to Quit Smoking Smoking tobacco is the leading cause of preventable death. It can affect almost every organ in the body. Smoking puts you and people around you at risk for many serious, long-lasting (chronic) diseases. Quitting smoking can be hard, but it is one of the best things that you can do for your health. It is never too late to quit. How do I get ready to quit? When you decide to quit smoking, make a plan to help you succeed. Before you quit:  Pick a date to quit. Set a date within the next 2 weeks to give you time to prepare.  Write down the reasons why you are quitting. Keep this list in places where you will see it often.  Tell your family, friends, and co-workers that you are quitting. Their support is important.  Talk with your doctor about the choices that may help you quit.  Find out if your health insurance will pay for these treatments.  Know the people, places, things, and activities that make you want to smoke (triggers). Avoid them. What first steps can I take to quit smoking?  Throw away all cigarettes at home, at work, and in your car.  Throw away the things that you use when you smoke, such as ashtrays and lighters.  Clean your car. Make sure to empty the ashtray.  Clean your home, including curtains and carpets. What can I do to help me quit smoking? Talk with your doctor about taking medicines and seeing a counselor at the same time. You are more likely to succeed when you do both.  If you are pregnant or breastfeeding, talk with your doctor about counseling or other ways to quit smoking. Do not take medicine to help you quit smoking unless your doctor tells you to do so. To quit smoking: Quit right away  Quit smoking  totally, instead of slowly cutting back on how much you smoke over a period of time.  Go to counseling. You are more likely to quit if you go to counseling sessions regularly. Take medicine You may take medicines to help you quit. Some medicines need a prescription, and some you can buy over-the-counter. Some medicines may contain a drug called nicotine to replace the nicotine in cigarettes. Medicines may:  Help you to stop having the desire to smoke (cravings).  Help to stop the problems that come when you stop smoking (withdrawal symptoms). Your doctor may ask you to use:  Nicotine patches, gum, or lozenges.  Nicotine inhalers or sprays.  Non-nicotine medicine that is taken by mouth. Find resources Find resources and other ways to help you quit smoking and remain smoke-free after you quit. These resources are most helpful when you use them often. They include:  Online chats with a Social worker.  Phone quitlines.  Printed Furniture conservator/restorer.  Support groups or group counseling.  Text messaging programs.  Mobile phone apps. Use apps on your mobile phone or tablet that can help you stick to your quit plan. There are many free apps for mobile phones and tablets as well as websites. Examples include Quit Guide from the [employer] and smokefree.gov   What things can I do to make it easier to quit?  Talk to your  family and friends. Ask them to support and encourage you.  Call a phone quitline (1-800-QUIT-NOW), reach out to support groups, or work with a Social worker.  Ask people who smoke to not smoke around you.  Avoid places that make you want to smoke, such as: ? Bars. ? Parties. ? Smoke-break areas at work.  Spend time with people who do not smoke.  Lower the stress in your life. Stress can make you want to smoke. Try these things to help your stress: ? Getting regular exercise. ? Doing deep-breathing exercises. ? Doing yoga. ? Meditating. ? Doing a body scan. To do this, close  your eyes, focus on one area of your body at a time from head to toe. Notice which parts of your body are tense. Try to relax the muscles in those areas.   How will I feel when I quit smoking? Day 1 to 3 weeks Within the first 24 hours, you may start to have some problems that come from quitting tobacco. These problems are very bad 2-3 days after you quit, but they do not often last for more than 2-3 weeks. You may get these symptoms:  Mood swings.  Feeling restless, nervous, angry, or annoyed.  Trouble concentrating.  Dizziness.  Strong desire for high-sugar foods and nicotine.  Weight gain.  Trouble pooping (constipation).  Feeling like you may vomit (nausea).  Coughing or a sore throat.  Changes in how the medicines that you take for other issues work in your body.  Depression.  Trouble sleeping (insomnia). Week 3 and afterward After the first 2-3 weeks of quitting, you may start to notice more positive results, such as:  Better sense of smell and taste.  Less coughing and sore throat.  Slower heart rate.  Lower blood pressure.  Clearer skin.  Better breathing.  Fewer sick days. Quitting smoking can be hard. Do not give up if you fail the first time. Some people need to try a few times before they succeed. Do your best to stick to your quit plan, and talk with your doctor if you have any questions or concerns. Summary  Smoking tobacco is the leading cause of preventable death. Quitting smoking can be hard, but it is one of the best things that you can do for your health.  When you decide to quit smoking, make a plan to help you succeed.  Quit smoking right away, not slowly over a period of time.  When you start quitting, seek help from your doctor, family, or friends. This information is not intended to replace advice given to you by your health care provider. Make sure you discuss any questions you have with your health care provider. Document Revised:  07/30/2019 Document Reviewed: 01/23/2019 Elsevier Patient Education  [place].

## 2020-12-19 NOTE — Telephone Encounter (Signed)
Pls also change her appointment to mid March, thanks

## 2020-12-19 NOTE — Progress Notes (Signed)
xaVirtual Visit via Telephone Note  I connected with [name] on 12/19/20 at  8:20 AM EST by telephone and verified that I am speaking with the correct person using two identifiers.  Location: Patient: : home  Provider: work   I discussed the limitations, risks, security and privacy concerns of performing an evaluation and management service by telephone and the availability of in person appointments. I also discussed with the patient that there may be a patient responsible charge related to this service. The patient expressed understanding and agreed to proceed.   History of Present Illness: C/o increased anxiety and stress needs to take her spouse to work, she works the night shift and is having increased anxiety due to sleep deprivation, requests medication to help with this  Denies depression No other concerns ROS otherwise negative Observations/Objective: Ht 5\' 3"  (1.6 m)   Wt 112 lb 6.4 oz (51 kg)   LMP 11/30/2014   BMI 19.91 kg/m  Good communication with no confusion and intact memory. Alert and oriented x 3 No signs of respiratory distress during speech    Assessment and Plan: Insomnia Sleep hygiene reviewed and written information offered also. Prescription sent for  medication needed.   Anxiety Increased due to lack of sleep, lexapro prescribed as discussed, pt called back within 1 hour stating that she needed xanax to help with the problem which she had used in the past, and that sh would not fill the lexapro, low dose xanax prescribed for limited duration   Current smoker Asked:confirms currently smokes cigarettes Assess: Unwilling to set a quit date, but is cutting back Advise: needs to QUIT to reduce risk of cancer, cardio and cerebrovascular disease Assist: counseled for 5 minutes and literature provided Arrange: follow up in 2 to 4 months     Follow Up Instructions:    I discussed the assessment and treatment plan with the patient. The  patient was provided an opportunity to ask questions and all were answered. The patient agreed with the plan and demonstrated an understanding of the instructions.   The patient was advised to call back or seek an in-person evaluation if the symptoms worsen or if the condition fails to improve as anticipated.  I provided 15 minutes of non-face-to-face time during this encounter.   Tula Nakayama, MD

## 2020-12-19 NOTE — Telephone Encounter (Signed)
Pt aware and appt changed.  

## 2020-12-19 NOTE — Telephone Encounter (Signed)
I discussed this at the visit and thought we had a clear understanding I am trying to call her back , her phone needs me to leave a message. She is not answering. I have sent in xanax low dose instead, and will remove the lexapro. I asked her to call you back , and  I explained that a  different mediation was at the pharmacy

## 2020-12-19 NOTE — Telephone Encounter (Signed)
Carrie Perez called back upset because she said the med that was called in for her (lexapro) is for depression and she is NOT depressed, just anxious because she can't sleep and this is not going to help her sleep and if she doesn't get sleep soon she cannot function. She is NOT going to get the lexapro because it is $48 and its not going to help her sleep. Wants a call back or to know what is going to be done about this

## 2020-12-20 ENCOUNTER — Encounter: Payer: Self-pay | Admitting: Family Medicine

## 2020-12-20 DIAGNOSIS — F419 Anxiety disorder, unspecified: Secondary | ICD-10-CM | POA: Insufficient documentation

## 2020-12-20 NOTE — Assessment & Plan Note (Signed)
Asked:confirms currently smokes cigarettes °Assess: Unwilling to set a quit date, but is cutting back °Advise: needs to QUIT to reduce risk of cancer, cardio and cerebrovascular disease °Assist: counseled for 5 minutes and literature provided °Arrange: follow up in 2 to 4 months ° °

## 2020-12-20 NOTE — Assessment & Plan Note (Signed)
Increased due to lack of sleep, lexapro prescribed as discussed, pt called back within 1 hour stating that she needed xanax to help with the problem which she had used in the past, and that sh would not fill the lexapro, low dose xanax prescribed for limited duration

## 2020-12-20 NOTE — Assessment & Plan Note (Signed)
Sleep hygiene reviewed and written information offered also. Prescription sent for  medication needed.  

## 2021-01-09 ENCOUNTER — Telehealth: Payer: BC Managed Care – PPO | Admitting: Nurse Practitioner

## 2021-01-15 ENCOUNTER — Other Ambulatory Visit: Payer: Self-pay

## 2021-01-15 ENCOUNTER — Ambulatory Visit (INDEPENDENT_AMBULATORY_CARE_PROVIDER_SITE_OTHER): Payer: BC Managed Care – PPO | Admitting: Nurse Practitioner

## 2021-01-15 ENCOUNTER — Encounter: Payer: Self-pay | Admitting: Nurse Practitioner

## 2021-01-15 DIAGNOSIS — K219 Gastro-esophageal reflux disease without esophagitis: Secondary | ICD-10-CM

## 2021-01-15 MED ORDER — OMEPRAZOLE 40 MG PO CPDR: 40.0000 mg | DELAYED_RELEASE_CAPSULE | Freq: Every day | ORAL | 1 refills | Status: DC

## 2021-01-15 NOTE — Assessment & Plan Note (Signed)
-  no recent PPI use -Rx. Omeprazole -had used PPT PRN previously -if no improvement will consider GI consult
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
## 2021-01-22 ENCOUNTER — Other Ambulatory Visit: Payer: Self-pay

## 2021-01-22 ENCOUNTER — Telehealth (INDEPENDENT_AMBULATORY_CARE_PROVIDER_SITE_OTHER): Payer: BC Managed Care – PPO | Admitting: Family Medicine

## 2021-01-22 ENCOUNTER — Encounter: Payer: Self-pay | Admitting: Family Medicine

## 2021-01-22 VITALS — Ht 63.0 in | Wt 110.0 lb

## 2021-01-22 DIAGNOSIS — F17200 Nicotine dependence, unspecified, uncomplicated: Secondary | ICD-10-CM

## 2021-01-22 DIAGNOSIS — K219 Gastro-esophageal reflux disease without esophagitis: Secondary | ICD-10-CM

## 2021-01-22 DIAGNOSIS — F321 Major depressive disorder, single episode, moderate: Secondary | ICD-10-CM

## 2021-01-22 DIAGNOSIS — E063 Autoimmune thyroiditis: Secondary | ICD-10-CM

## 2021-01-22 DIAGNOSIS — F419 Anxiety disorder, unspecified: Secondary | ICD-10-CM

## 2021-01-22 DIAGNOSIS — E038 Other specified hypothyroidism: Secondary | ICD-10-CM

## 2021-01-22 DIAGNOSIS — F5104 Psychophysiologic insomnia: Secondary | ICD-10-CM

## 2021-01-22 MED ORDER — ESCITALOPRAM OXALATE 10 MG PO TABS: 10.0000 mg | ORAL_TABLET | Freq: Every day | ORAL | 2 refills | Status: DC

## 2021-01-22 NOTE — Patient Instructions (Signed)
F/U in 2 months, call if you need me before  New additional medication for depression is lexapro take every day at bedtime, continue mitarzapine as before   Please recognize that you are only one person and can do so much and no more, continue to work on getting your health together  Thanks for choosing [facility], we consider it a privelige to serve you.
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
## 2021-01-23 ENCOUNTER — Encounter: Payer: Self-pay | Admitting: Family Medicine

## 2021-01-23 DIAGNOSIS — F321 Major depressive disorder, single episode, moderate: Secondary | ICD-10-CM | POA: Insufficient documentation

## 2021-01-23 DIAGNOSIS — F324 Major depressive disorder, single episode, in partial remission: Secondary | ICD-10-CM | POA: Insufficient documentation
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
## 2021-01-23 NOTE — Assessment & Plan Note (Addendum)
Managed by Endo, may need to update TSH, has appt in 1 month with Endo

## 2021-01-23 NOTE — Assessment & Plan Note (Signed)
Sleep hygiene reviewed, continue remeron and bed time xanax

## 2021-01-23 NOTE — Assessment & Plan Note (Signed)
Pt advised to continue PPI

## 2021-01-23 NOTE — Assessment & Plan Note (Signed)
Asked:confirms currently smokes cigarettes Assess: Unwilling to set a quit date, but is cutting back

## 2021-01-23 NOTE — Assessment & Plan Note (Signed)
Start daily lexapro, continue bedtime xanax

## 2021-01-30 ENCOUNTER — Telehealth: Payer: BC Managed Care – PPO | Admitting: Family Medicine

## 2021-02-06 ENCOUNTER — Ambulatory Visit: Payer: BC Managed Care – PPO | Admitting: Family Medicine

## 2021-02-13 DIAGNOSIS — E063 Autoimmune thyroiditis: Secondary | ICD-10-CM | POA: Diagnosis not present

## 2021-02-13 DIAGNOSIS — E038 Other specified hypothyroidism: Secondary | ICD-10-CM | POA: Diagnosis not present

## 2021-02-14 LAB — TSH: TSH: 0.659 u[IU]/mL (ref 0.450–4.500)

## 2021-02-14 LAB — T4, FREE: Free T4: 1.43 ng/dL (ref 0.82–1.77)

## 2021-02-19 ENCOUNTER — Ambulatory Visit (INDEPENDENT_AMBULATORY_CARE_PROVIDER_SITE_OTHER): Payer: BC Managed Care – PPO | Admitting: "Endocrinology

## 2021-02-19 ENCOUNTER — Other Ambulatory Visit: Payer: Self-pay

## 2021-02-19 ENCOUNTER — Encounter: Payer: Self-pay | Admitting: "Endocrinology

## 2021-02-19 VITALS — BP 102/58 | HR 56 | Ht 63.0 in | Wt 110.0 lb

## 2021-02-19 DIAGNOSIS — E038 Other specified hypothyroidism: Secondary | ICD-10-CM

## 2021-02-19 DIAGNOSIS — E063 Autoimmune thyroiditis: Secondary | ICD-10-CM | POA: Diagnosis not present

## 2021-02-19 MED ORDER — LEVOTHYROXINE SODIUM 125 MCG PO TABS
62.5000 ug | ORAL_TABLET | Freq: Every day | ORAL | 1 refills | Status: DC
Start: 2021-02-19 — End: 2021-06-12
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
## 2021-02-20 ENCOUNTER — Ambulatory Visit: Payer: BC Managed Care – PPO | Admitting: "Endocrinology

## 2021-02-27 ENCOUNTER — Ambulatory Visit: Payer: BC Managed Care – PPO | Admitting: Family Medicine

## 2021-03-08 ENCOUNTER — Telehealth: Payer: Self-pay

## 2021-03-08 NOTE — Telephone Encounter (Signed)
Is there anything she can do at home to help this or does this need uc evaluation?

## 2021-03-08 NOTE — Telephone Encounter (Signed)
Pt is calling states she has ringing in her Right Ear. She needs to know what to do. There was no appointments avail.

## 2021-03-08 NOTE — Telephone Encounter (Signed)
May have wax in ear causing abn sound, if bothersome enough and unable to wai until available appt recommend UC

## 2021-03-08 NOTE — Telephone Encounter (Signed)
Called pt and told her to make appt with provider when next available. If painful or bothersome she can see urgent care. Please schedule when available

## 2021-03-12 ENCOUNTER — Ambulatory Visit: Payer: BC Managed Care – PPO | Admitting: Nurse Practitioner

## 2021-03-12 NOTE — Telephone Encounter (Signed)
Called pt to follow up , and she did some ear drops , and she is feeling better

## 2021-03-26 ENCOUNTER — Ambulatory Visit (INDEPENDENT_AMBULATORY_CARE_PROVIDER_SITE_OTHER): Payer: BC Managed Care – PPO | Admitting: Family Medicine

## 2021-03-26 ENCOUNTER — Other Ambulatory Visit: Payer: Self-pay

## 2021-03-26 ENCOUNTER — Encounter: Payer: Self-pay | Admitting: Family Medicine

## 2021-03-26 VITALS — BP 124/66 | Resp 15 | Ht 63.0 in | Wt 111.1 lb

## 2021-03-26 DIAGNOSIS — E038 Other specified hypothyroidism: Secondary | ICD-10-CM | POA: Diagnosis not present

## 2021-03-26 DIAGNOSIS — J3089 Other allergic rhinitis: Secondary | ICD-10-CM

## 2021-03-26 DIAGNOSIS — F17200 Nicotine dependence, unspecified, uncomplicated: Secondary | ICD-10-CM | POA: Diagnosis not present

## 2021-03-26 DIAGNOSIS — E785 Hyperlipidemia, unspecified: Secondary | ICD-10-CM

## 2021-03-26 DIAGNOSIS — E063 Autoimmune thyroiditis: Secondary | ICD-10-CM

## 2021-03-26 DIAGNOSIS — F3341 Major depressive disorder, recurrent, in partial remission: Secondary | ICD-10-CM

## 2021-03-26 DIAGNOSIS — E559 Vitamin D deficiency, unspecified: Secondary | ICD-10-CM

## 2021-03-26 DIAGNOSIS — E039 Hypothyroidism, unspecified: Secondary | ICD-10-CM

## 2021-03-26 DIAGNOSIS — F419 Anxiety disorder, unspecified: Secondary | ICD-10-CM

## 2021-03-26 DIAGNOSIS — Z1231 Encounter for screening mammogram for malignant neoplasm of breast: Secondary | ICD-10-CM

## 2021-03-26 DIAGNOSIS — F5104 Psychophysiologic insomnia: Secondary | ICD-10-CM

## 2021-03-26 MED ORDER — ALPRAZOLAM 0.25 MG PO TABS: ORAL_TABLET | ORAL | 0 refills | Status: DC

## 2021-03-26 MED ORDER — PREDNISONE 10 MG PO TABS: 10.0000 mg | ORAL_TABLET | Freq: Two times a day (BID) | ORAL | 0 refills | Status: DC

## 2021-03-26 MED ORDER — METHYLPREDNISOLONE ACETATE 80 MG/ML IJ SUSP
40.0000 mg | Freq: Once | INTRAMUSCULAR | Status: AC
  Administered 2021-03-26: 40 mg via INTRAMUSCULAR

## 2021-03-26 NOTE — Patient Instructions (Addendum)
Annual exam in September, call if you need me sooner  Please schedule mammogram at checkout  Fasting lipid, cmp and eGFR and vit d and cBC are past due , please get in the next 1 week  Depo Medrol 40 mg IM in office for uncontrolled allergies and 5 day course of prednisone is prescribed  Please call if ringing in the ears persists or worsens I will refer you to ENT  Alprazolam tablet for anxiety is to be used at MOST 3 times per week  Thanks for choosing [facility], we consider it a privelige to serve you.

## 2021-03-28 ENCOUNTER — Encounter: Payer: Self-pay | Admitting: Family Medicine

## 2021-03-28 NOTE — Assessment & Plan Note (Signed)
Sleep hygiene reviewed and written information offered also. Prescription sent for  medication needed.  

## 2021-03-28 NOTE — Assessment & Plan Note (Signed)
Currently increased, xanax at bedtime as needed, max 3 times / week

## 2021-03-28 NOTE — Assessment & Plan Note (Addendum)
Current flare, depo medrol 40 mg IM in office followed  By a short course of prednisone

## 2021-03-28 NOTE — Assessment & Plan Note (Signed)
Controlled on medication

## 2021-03-28 NOTE — Progress Notes (Signed)
   Carrie Perez     MRN: [id number]      DOB: 18-Nov-1961   HPI Carrie Perez is here for follow up and re-evaluation of chronic medical conditions, medication management and review of any available recent lab and radiology data.  Preventive health is updated, specifically  Cancer screening and Immunization.   Questions or concerns regarding consultations or procedures which the PT has had in the interim are  addressed. The PT denies any adverse reactions to current medications since the last visit.  C/o uncontrolled allergy symptoms , wants steroid shot which helps generally C/o increased stress as sister recently dx with cancer, not willing to set a quit date as yet C/o hot flashes, no interest in hormone replacement   ROS Denies recent fever or chills. Denies sinus pressure, nasal congestion, ear pain or sore throat. Denies chest congestion, productive cough or wheezing. Denies chest pains, palpitations and leg swelling Denies abdominal pain, nausea, vomiting,diarrhea or constipation.   Denies dysuria, frequency, hesitancy or incontinence. Denies joint pain, swelling and limitation in mobility. Denies headaches, seizures, numbness, or tingling. Deniesuncontrolled depression has increased  anxiety an  Insomnia as a result of anxiety , hot flashes also contribute Denies skin break down or rash.   PE  BP 124/66   Resp 15   Ht 5\' 3"  (1.6 m)   Wt 111 lb 1.9 oz (50.4 kg)   LMP 11/30/2014   SpO2 96%   BMI 19.68 kg/m   Patient alert and oriented and in no cardiopulmonary distress.  HEENT: No facial asymmetry, EOMI,     Neck supple .Nasal congestion,. No sinus tenderness, TM clear, no cervical adenitis  Chest: Clear to auscultation bilaterally.  CVS: S1, S2 no murmurs, no S3.Regular rate.  ABD: Soft non tender.   Ext: No edema  MS: Adequate ROM spine, shoulders, hips and knees.  Skin: Intact, no ulcerations or rash noted.  Psych: Good eye contact, normal affect. Memory  intact not anxious or depressed appearing.  CNS: CN 2-12 intact, power,  normal throughout.no focal deficits noted.   Assessment & Plan  Allergic rhinitis Current flare, depo medrol 40 mg IM in office followed  By a short course of prednisone  Acquired hypothyroidism Managed by Endo and controlled   Major depression in partial remission ([place]) Controlled on medication  Insomnia Sleep hygiene reviewed and written information offered also. Prescription sent for  medication needed.   Anxiety Currently increased, xanax at bedtime as needed, max 3 times / week  Current smoker Asked:confirms currently smokes cigarettes Assess: Unwilling to set a quit date, but is cutting back Advise: needs to QUIT to reduce risk of cancer, cardio and cerebrovascular disease Assist: counseled for 5 minutes and literature provided Arrange: follow up in 2 to 4 months

## 2021-03-28 NOTE — Assessment & Plan Note (Signed)
Managed by Endo and controlled 

## 2021-03-28 NOTE — Assessment & Plan Note (Signed)
Asked:confirms currently smokes cigarettes °Assess: Unwilling to set a quit date, but is cutting back °Advise: needs to QUIT to reduce risk of cancer, cardio and cerebrovascular disease °Assist: counseled for 5 minutes and literature provided °Arrange: follow up in 2 to 4 months ° °

## 2021-04-02 DIAGNOSIS — E559 Vitamin D deficiency, unspecified: Secondary | ICD-10-CM | POA: Diagnosis not present

## 2021-04-02 DIAGNOSIS — E785 Hyperlipidemia, unspecified: Secondary | ICD-10-CM | POA: Diagnosis not present

## 2021-04-03 LAB — LIPID PANEL
Chol/HDL Ratio: 3.3 ratio (ref 0.0–4.4)
Cholesterol, Total: 224 mg/dL — ABNORMAL HIGH (ref 100–199)
HDL: 68 mg/dL (ref >39)
LDL Chol Calc (NIH): 147 mg/dL — ABNORMAL HIGH (ref 0–99)
Triglycerides: 53 mg/dL (ref 0–149)
VLDL Cholesterol Cal: 9 mg/dL (ref 5–40)

## 2021-04-03 LAB — CMP14+EGFR
ALT: 14 IU/L (ref 0–32)
AST: 16 IU/L (ref 0–40)
Albumin/Globulin Ratio: 1.7 (ref 1.2–2.2)
Albumin: 4.3 g/dL (ref 3.8–4.9)
Alkaline Phosphatase: 76 IU/L (ref 44–121)
BUN/Creatinine Ratio: 22 (ref 9–23)
BUN: 17 mg/dL (ref 6–24)
Bilirubin Total: 0.4 mg/dL (ref 0.0–1.2)
CO2: 25 mmol/L (ref 20–29)
Calcium: 9.8 mg/dL (ref 8.7–10.2)
Chloride: 102 mmol/L (ref 96–106)
Creatinine, Ser: 0.76 mg/dL (ref 0.57–1.00)
Globulin, Total: 2.6 g/dL (ref 1.5–4.5)
Glucose: 95 mg/dL (ref 65–99)
Potassium: 4 mmol/L (ref 3.5–5.2)
Sodium: 139 mmol/L (ref 134–144)
Total Protein: 6.9 g/dL (ref 6.0–8.5)
eGFR: 91 mL/min/{1.73_m2} (ref >59)

## 2021-04-03 LAB — CBC
Hematocrit: 41.8 % (ref 34.0–46.6)
Hemoglobin: 14.2 g/dL (ref 11.1–15.9)
MCH: 30.6 pg (ref 26.6–33.0)
MCHC: 34 g/dL (ref 31.5–35.7)
MCV: 90 fL (ref 79–97)
Platelets: 283 10*3/uL (ref 150–450)
RBC: 4.64 x10E6/uL (ref 3.77–5.28)
RDW: 13.6 % (ref 11.7–15.4)
WBC: 8.4 10*3/uL (ref 3.4–10.8)

## 2021-04-03 LAB — VITAMIN D 25 HYDROXY (VIT D DEFICIENCY, FRACTURES): Vit D, 25-Hydroxy: 44.9 ng/mL (ref 30.0–100.0)

## 2021-04-23 ENCOUNTER — Other Ambulatory Visit: Payer: Self-pay | Admitting: Family Medicine

## 2021-05-15 ENCOUNTER — Telehealth: Payer: Self-pay

## 2021-05-15 ENCOUNTER — Other Ambulatory Visit: Payer: Self-pay

## 2021-05-15 MED ORDER — CETIRIZINE HCL 10 MG PO TABS: 10.0000 mg | ORAL_TABLET | Freq: Every day | ORAL | 3 refills | Status: DC

## 2021-05-15 NOTE — Telephone Encounter (Signed)
Aware that she can

## 2021-05-15 NOTE — Telephone Encounter (Signed)
Patient called asking anything can be sent in to the pharmacy for her back pain. Can she take tylenol with her thyroid issues. Please contact patient back # [phone]. [facility] [address] [place]

## 2021-05-31 ENCOUNTER — Ambulatory Visit (HOSPITAL_COMMUNITY)
Admission: RE | Admit: 2021-05-31 | Discharge: 2021-05-31 | Disposition: A | Discharge status: Home / Self Care | Payer: BC Managed Care – PPO | Source: Ambulatory Visit | Attending: Family Medicine | Admitting: Family Medicine

## 2021-05-31 ENCOUNTER — Encounter: Payer: Self-pay | Admitting: Family Medicine

## 2021-05-31 ENCOUNTER — Other Ambulatory Visit: Payer: Self-pay

## 2021-05-31 ENCOUNTER — Ambulatory Visit: Payer: BC Managed Care – PPO | Admitting: Family Medicine

## 2021-05-31 VITALS — BP 134/68 | HR 63 | Resp 16 | Ht 63.0 in | Wt 112.0 lb

## 2021-05-31 DIAGNOSIS — F17200 Nicotine dependence, unspecified, uncomplicated: Secondary | ICD-10-CM | POA: Diagnosis not present

## 2021-05-31 DIAGNOSIS — E039 Hypothyroidism, unspecified: Secondary | ICD-10-CM

## 2021-05-31 DIAGNOSIS — G8929 Other chronic pain: Secondary | ICD-10-CM | POA: Diagnosis not present

## 2021-05-31 DIAGNOSIS — M47814 Spondylosis without myelopathy or radiculopathy, thoracic region: Secondary | ICD-10-CM | POA: Diagnosis not present

## 2021-05-31 DIAGNOSIS — M546 Pain in thoracic spine: Secondary | ICD-10-CM

## 2021-05-31 MED ORDER — KETOROLAC TROMETHAMINE 60 MG/2ML IM SOLN
60.0000 mg | Freq: Once | INTRAMUSCULAR | Status: AC
  Administered 2021-05-31: 60 mg via INTRAMUSCULAR

## 2021-05-31 MED ORDER — GABAPENTIN 100 MG PO CAPS: 100.0000 mg | ORAL_CAPSULE | Freq: Every day | ORAL | 3 refills | Status: DC

## 2021-05-31 MED ORDER — PREDNISONE 5 MG PO TABS: 5.0000 mg | ORAL_TABLET | Freq: Two times a day (BID) | ORAL | 0 refills | Status: AC

## 2021-05-31 MED ORDER — METHYLPREDNISOLONE ACETATE 80 MG/ML IJ SUSP
80.0000 mg | Freq: Once | INTRAMUSCULAR | Status: AC
  Administered 2021-05-31: 80 mg via INTRAMUSCULAR

## 2021-05-31 NOTE — Patient Instructions (Signed)
Keep September appointment as already scheduled and call if you need me sooner.  Toradol 60 mg IM and Depo-Medrol 40 mg IM in the office today for back pain.  5-day course of prednisone is also prescribed.  Gabapentin 100 mg 1 at bedtime is to be taken for chronic back pain.  Please get x-ray of your back today.  Please continue to work on quitting smoking.   Thanks for choosing [facility], we consider it a privelige to serve you.

## 2021-05-31 NOTE — Progress Notes (Signed)
   Carrie Perez     MRN: [id number]      DOB: 1961-05-22   HPI Carrie Perez is here with an approx 10 day h/o escalating mid back pain , no specific aggravating factor known, it is non radiaiting, keeps her awake at night rated up to a 9 ROS Denies recent fever or chills. Denies sinus pressure, nasal congestion, ear pain or sore throat. Denies chest congestion, productive cough or wheezing. Denies chest pains, palpitations and leg swelling Denies abdominal pain, nausea, vomiting,diarrhea or constipation.   Denies dysuria, frequency, hesitancy or incontinence. Denies  uncontrolled depression, anxiety  .   PE  BP 134/68   Pulse 63   Resp 16   Ht 5\' 3"  (1.6 m)   Wt 112 lb (50.8 kg)   LMP 11/30/2014   SpO2 98%   BMI 19.84 kg/m   Patient alert and oriented and in no cardiopulmonary distress.  HEENT: No facial asymmetry, EOMI,     Neck supple .  Chest: Clear to auscultation bilaterally.  CVS: S1, S2 no murmurs, no S3.Regular rate.  ABD: Soft non tender.   Ext: No edema  MS: Adequate ROM spine, shoulders, hips and knees.  Skin: Intact, no ulcerations or rash noted.  Psych: Good eye contact, normal affect. Memory intact not anxious or depressed appearing.  CNS: CN 2-12 intact, power,  normal throughout.no focal deficits noted.   Assessment & Plan  Current smoker Asked:confirms currently smokes cigarettes Assess: Unwilling to set a quit date, but is cutting back Advise: needs to QUIT to reduce risk of cancer, cardio and cerebrovascular disease Assist: counseled for 5 minutes and literature provided Arrange: follow up in 2 to 4 months   Thoracic spine pain .Uncontrolled.Toradol and depo medrol administered IM in the office , to be followed by a short course of oral prednisone    Acquired hypothyroidism Managed by Endo and controlled

## 2021-05-31 NOTE — Assessment & Plan Note (Signed)
Uncontrolled.Toradol and depo medrol administered IM in the office , to be followed by a short course of oral prednisone   

## 2021-05-31 NOTE — Assessment & Plan Note (Signed)
Asked:confirms currently smokes cigarettes °Assess: Unwilling to set a quit date, but is cutting back °Advise: needs to QUIT to reduce risk of cancer, cardio and cerebrovascular disease °Assist: counseled for 5 minutes and literature provided °Arrange: follow up in 2 to 4 months ° °

## 2021-06-04 NOTE — Assessment & Plan Note (Signed)
Managed by Endo and controlled 

## 2021-06-12 ENCOUNTER — Telehealth: Payer: Self-pay

## 2021-06-12 DIAGNOSIS — E038 Other specified hypothyroidism: Secondary | ICD-10-CM

## 2021-06-12 MED ORDER — LEVOTHYROXINE SODIUM 125 MCG PO TABS: 62.5000 ug | ORAL_TABLET | Freq: Every day | ORAL | 0 refills | Status: DC

## 2021-06-12 NOTE — Telephone Encounter (Signed)
Patient requesting refill on her thyroid medication. [facility] [address].

## 2021-06-12 NOTE — Telephone Encounter (Signed)
Rx sent 

## 2021-06-21 ENCOUNTER — Other Ambulatory Visit: Payer: Self-pay | Admitting: Family Medicine

## 2021-07-30 ENCOUNTER — Ambulatory Visit (HOSPITAL_COMMUNITY)
Admission: RE | Admit: 2021-07-30 | Discharge: 2021-07-30 | Disposition: A | Discharge status: Home / Self Care | Payer: BC Managed Care – PPO | Source: Ambulatory Visit | Attending: Family Medicine | Admitting: Family Medicine

## 2021-07-30 ENCOUNTER — Other Ambulatory Visit: Payer: Self-pay

## 2021-07-30 DIAGNOSIS — Z1231 Encounter for screening mammogram for malignant neoplasm of breast: Secondary | ICD-10-CM | POA: Insufficient documentation

## 2021-08-03 ENCOUNTER — Other Ambulatory Visit: Payer: Self-pay

## 2021-08-03 ENCOUNTER — Ambulatory Visit (INDEPENDENT_AMBULATORY_CARE_PROVIDER_SITE_OTHER): Payer: BC Managed Care – PPO | Admitting: Family Medicine

## 2021-08-03 ENCOUNTER — Encounter: Payer: Self-pay | Admitting: Family Medicine

## 2021-08-03 VITALS — BP 120/60 | HR 70 | Temp 98.0°F | Resp 18 | Ht 63.0 in | Wt 113.0 lb

## 2021-08-03 DIAGNOSIS — R350 Frequency of micturition: Secondary | ICD-10-CM

## 2021-08-03 DIAGNOSIS — Z23 Encounter for immunization: Secondary | ICD-10-CM

## 2021-08-03 DIAGNOSIS — K219 Gastro-esophageal reflux disease without esophagitis: Secondary | ICD-10-CM | POA: Diagnosis not present

## 2021-08-03 DIAGNOSIS — F419 Anxiety disorder, unspecified: Secondary | ICD-10-CM | POA: Diagnosis not present

## 2021-08-03 LAB — POCT URINALYSIS DIPSTICK
Bilirubin, UA: NEGATIVE
Blood, UA: NEGATIVE
Glucose, UA: NEGATIVE
Ketones, UA: NEGATIVE
Leukocytes, UA: NEGATIVE
Nitrite, UA: NEGATIVE
Protein, UA: NEGATIVE
Spec Grav, UA: 1.015 (ref 1.010–1.025)
Urobilinogen, UA: 0.2 E.U./dL
pH, UA: 6 (ref 5.0–8.0)

## 2021-08-03 MED ORDER — ESCITALOPRAM OXALATE 10 MG PO TABS: 10.0000 mg | ORAL_TABLET | Freq: Every day | ORAL | 5 refills | Status: DC

## 2021-08-03 MED ORDER — PANTOPRAZOLE SODIUM 20 MG PO TBEC: 20.0000 mg | DELAYED_RELEASE_TABLET | Freq: Every day | ORAL | 3 refills | Status: DC

## 2021-08-03 NOTE — Progress Notes (Signed)
   Carrie Perez     MRN: [id number]      DOB: 1961/05/18   HPI Carrie Perez is here with a 3 day h/o urianry frequency, denies dysuria, flank pain or chills, drinks a lot of water before her bedtime  ROS Denies recent fever or chills. Denies sinus pressure, nasal congestion, ear pain or sore throat. Denies chest congestion, productive cough or wheezing. Denies chest pains, palpitations and leg swelling Denies depression, anxiety or insomnia. Denies skin break down or rash.   PE  BP 120/60 (BP Location: Right Arm, Patient Position: Sitting, Cuff Size: Normal)   Pulse 70   Temp 98 F (36.7 C)   Resp 18   Ht '5\' 3"'$  (1.6 m)   Wt 113 lb (51.3 kg)   LMP 11/30/2014   SpO2 95%   BMI 20.02 kg/m   Patient alert and oriented and in no cardiopulmonary distress.  HEENT: No facial asymmetry, EOMI,     Neck supple .  Chest: Clear to auscultation bilaterally.  CVS: S1, S2 no murmurs, no S3.Regular rate.  ABD: Soft non tender. No renal angle tenderness  Ext: No edema  MS: Adequate ROM spine, shoulders, hips and knees.  Skin: Intact, no ulcerations or rash noted.  Psych: Good eye contact, normal affect. Memory intact not anxious or depressed appearing.  CNS: CN 2-12 intact, power,  normal throughout.no focal deficits noted.   Assessment & Plan  Urinary frequency  Normal CCUA and will change the time she drinks water  GERD (gastroesophageal reflux disease) Controlled, no change in medication   Anxiety Controlled, no change in medication

## 2021-08-03 NOTE — Patient Instructions (Addendum)
F/U as before, call if you need me sooner  Flu vaccine today  Nurse please refill lexapro  Urine is totally normal, change when you drink the most water  Protonix is prescribed

## 2021-08-06 ENCOUNTER — Encounter: Payer: Self-pay | Admitting: Family Medicine

## 2021-08-06 ENCOUNTER — Telehealth: Payer: Self-pay

## 2021-08-06 DIAGNOSIS — R350 Frequency of micturition: Secondary | ICD-10-CM | POA: Insufficient documentation

## 2021-08-06 NOTE — Assessment & Plan Note (Signed)
Normal CCUA and will change the time she drinks water

## 2021-08-06 NOTE — Assessment & Plan Note (Signed)
Controlled, no change in medication  

## 2021-08-06 NOTE — Telephone Encounter (Signed)
Patient needing refill on xanax 0.25 mg ph# [phone]

## 2021-08-07 ENCOUNTER — Encounter: Payer: BC Managed Care – PPO | Admitting: Family Medicine

## 2021-08-07 ENCOUNTER — Other Ambulatory Visit: Payer: Self-pay | Admitting: Family Medicine

## 2021-08-07 MED ORDER — ALPRAZOLAM 0.25 MG PO TABS: ORAL_TABLET | ORAL | 0 refills | Status: DC

## 2021-08-14 DIAGNOSIS — E063 Autoimmune thyroiditis: Secondary | ICD-10-CM | POA: Diagnosis not present

## 2021-08-14 DIAGNOSIS — E038 Other specified hypothyroidism: Secondary | ICD-10-CM | POA: Diagnosis not present

## 2021-08-15 LAB — T4, FREE: Free T4: 1.25 ng/dL (ref 0.82–1.77)

## 2021-08-15 LAB — TSH: TSH: 0.217 u[IU]/mL — ABNORMAL LOW (ref 0.450–4.500)

## 2021-08-21 ENCOUNTER — Encounter: Payer: Self-pay | Admitting: "Endocrinology

## 2021-08-21 ENCOUNTER — Other Ambulatory Visit: Payer: Self-pay

## 2021-08-21 ENCOUNTER — Ambulatory Visit (INDEPENDENT_AMBULATORY_CARE_PROVIDER_SITE_OTHER): Payer: BC Managed Care – PPO | Admitting: "Endocrinology

## 2021-08-21 VITALS — BP 102/74 | HR 72 | Ht 63.0 in | Wt 111.0 lb

## 2021-08-21 DIAGNOSIS — E063 Autoimmune thyroiditis: Secondary | ICD-10-CM | POA: Diagnosis not present

## 2021-08-21 DIAGNOSIS — E038 Other specified hypothyroidism: Secondary | ICD-10-CM

## 2021-08-21 MED ORDER — LEVOTHYROXINE SODIUM 125 MCG PO TABS: 62.5000 ug | ORAL_TABLET | Freq: Every day | ORAL | 1 refills | Status: DC
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
## 2021-09-26 ENCOUNTER — Telehealth: Payer: Self-pay | Admitting: Family Medicine

## 2021-09-26 ENCOUNTER — Other Ambulatory Visit: Payer: Self-pay | Admitting: Family Medicine

## 2021-09-26 NOTE — Telephone Encounter (Signed)
Pt called in for refill on ALPRAZOLAM to [facility]  pharm needs authorization

## 2021-09-27 NOTE — Telephone Encounter (Signed)
Pt requests xanax refill

## 2021-09-28 ENCOUNTER — Other Ambulatory Visit: Payer: Self-pay | Admitting: Family Medicine

## 2021-09-28 MED ORDER — ALPRAZOLAM 0.25 MG PO TABS: 0.2500 mg | ORAL_TABLET | Freq: Every evening | ORAL | 0 refills | Status: DC | PRN

## 2021-10-03 ENCOUNTER — Encounter: Payer: Self-pay | Admitting: Internal Medicine

## 2021-10-03 ENCOUNTER — Other Ambulatory Visit: Payer: Self-pay

## 2021-10-03 ENCOUNTER — Ambulatory Visit (INDEPENDENT_AMBULATORY_CARE_PROVIDER_SITE_OTHER): Payer: BC Managed Care – PPO | Admitting: Internal Medicine

## 2021-10-03 DIAGNOSIS — J309 Allergic rhinitis, unspecified: Secondary | ICD-10-CM | POA: Diagnosis not present

## 2021-10-03 MED ORDER — PREDNISONE 10 MG PO TABS: 10.0000 mg | ORAL_TABLET | Freq: Two times a day (BID) | ORAL | 0 refills | Status: DC

## 2021-10-03 MED ORDER — TRIAMCINOLONE ACETONIDE 55 MCG/ACT NA AERO: 2.0000 | INHALATION_SPRAY | Freq: Every day | NASAL | 12 refills | Status: DC
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
## 2021-10-09 ENCOUNTER — Telehealth: Payer: Self-pay | Admitting: Family Medicine

## 2021-10-09 ENCOUNTER — Ambulatory Visit (INDEPENDENT_AMBULATORY_CARE_PROVIDER_SITE_OTHER): Payer: BC Managed Care – PPO | Admitting: Family Medicine

## 2021-10-09 ENCOUNTER — Other Ambulatory Visit: Payer: Self-pay

## 2021-10-09 ENCOUNTER — Encounter: Payer: Self-pay | Admitting: Family Medicine

## 2021-10-09 VITALS — BP 117/78 | HR 72 | Resp 17 | Ht 63.0 in | Wt 111.1 lb

## 2021-10-09 DIAGNOSIS — F5104 Psychophysiologic insomnia: Secondary | ICD-10-CM | POA: Diagnosis not present

## 2021-10-09 DIAGNOSIS — Z0000 Encounter for general adult medical examination without abnormal findings: Secondary | ICD-10-CM | POA: Diagnosis not present

## 2021-10-09 DIAGNOSIS — E785 Hyperlipidemia, unspecified: Secondary | ICD-10-CM | POA: Diagnosis not present

## 2021-10-09 DIAGNOSIS — E559 Vitamin D deficiency, unspecified: Secondary | ICD-10-CM | POA: Diagnosis not present

## 2021-10-09 MED ORDER — GABAPENTIN 100 MG PO CAPS: ORAL_CAPSULE | ORAL | 3 refills | Status: DC

## 2021-10-09 NOTE — Patient Instructions (Signed)
F/U  first week in May, call if you need me sooner  Please get covid booster  Please work on quitting smoking again, call [id number] for help  Fasting cBC, lipid, cmp and EGFR and vit D end April  New higher dose of gabapentin for sleep, burning and hot flashes, take TWO 178m capsules daily at bedtime ( in the morning)  Thanks for choosing [facility] we consider it a privelige to serve you.  Sincere condolence to your entire family at this time

## 2021-10-09 NOTE — Assessment & Plan Note (Signed)

## 2021-10-09 NOTE — Progress Notes (Signed)
    Carrie Perez     MRN: [id number]      DOB: 12-29-1960  HPI: Patient is in for annual physical exam. C/o burning sensation, poor sleep and uncontrolled hot flashes . Immunization is reviewed , and  updated if needed.   PE: BP 117/78   Pulse 72   Resp 17   Ht 5\' 3"  (1.6 m)   Wt 111 lb 1.9 oz (50.4 kg)   LMP 11/30/2014   SpO2 98%   BMI 19.68 kg/m   Pleasant  female, alert and oriented x 3, in no cardio-pulmonary distress. Afebrile. HEENT No facial trauma or asymetry. Sinuses non tender.  Extra occullar muscles intact.. External ears normal, . Neck: supple, no adenopathy,JVD or thyromegaly.No bruits.  Chest: Clear to ascultation bilaterally.No crackles or wheezes. Non tender to palpation  Breast: No asymetry,no masses or lumps. No tenderness. No nipple discharge or inversion. No axillary or supraclavicular adenopathy  Cardiovascular system; Heart sounds normal,  S1 and  S2 ,no S3.  No murmur, or thrill. Apical beat not displaced Peripheral pulses normal.  Abdomen: Soft, non tender, no organomegaly or masses. No bruits. Bowel sounds normal. No guarding, tenderness or rebound.   .   Musculoskeletal exam: Full ROM of spine, hips , shoulders and knees. No deformity ,swelling or crepitus noted. No muscle wasting or atrophy.   Neurologic: Cranial nerves 2 to 12 intact. Power, tone ,sensation and reflexes normal throughout. No disturbance in gait. No tremor.  Skin: Intact, no ulceration, erythema , scaling or rash noted. Pigmentation normal throughout  Psych; Normal mood and affect. Judgement and concentration normal   Assessment & Plan:  Annual physical exam Annual exam as documented. Counseling done  re healthy lifestyle involving commitment to 150 minutes exercise per week, heart healthy diet, and attaining healthy weight.The importance of adequate sleep also discussed. Regular seat belt use and home safety, is also discussed. Changes in  health habits are decided on by the patient with goals and time frames  set for achieving them. Immunization and cancer screening needs are specifically addressed at this visit.   Insomnia Sleep hygiene reviewed and written information offered also. Prescription sent for  medication needed. Increase gabapentin to 200 mg at bedtime, will also address hot flashes and burning

## 2021-10-09 NOTE — Telephone Encounter (Signed)
Fyi- pt here today

## 2021-10-09 NOTE — Telephone Encounter (Signed)
Sister passed--Card sent

## 2021-10-15 ENCOUNTER — Encounter: Payer: Self-pay | Admitting: Family Medicine

## 2021-10-15 NOTE — Assessment & Plan Note (Signed)
Sleep hygiene reviewed and written information offered also. Prescription sent for  medication needed. Increase gabapentin to 200 mg at bedtime, will also address hot flashes and burning

## 2021-11-19 ENCOUNTER — Other Ambulatory Visit: Payer: Self-pay

## 2021-11-19 ENCOUNTER — Telehealth: Payer: Self-pay

## 2021-11-19 MED ORDER — CLOTRIMAZOLE-BETAMETHASONE 1-0.05 % EX CREA: 1.0000 "application " | TOPICAL_CREAM | Freq: Two times a day (BID) | CUTANEOUS | 1 refills | Status: DC

## 2021-11-19 NOTE — Telephone Encounter (Signed)
Patient request refill on Clotrimazole-Betamethasone (Cream)

## 2021-11-19 NOTE — Telephone Encounter (Signed)
refilled 

## 2021-12-11 ENCOUNTER — Other Ambulatory Visit: Payer: Self-pay | Admitting: Family Medicine

## 2021-12-18 ENCOUNTER — Telehealth: Payer: Self-pay

## 2021-12-18 ENCOUNTER — Other Ambulatory Visit: Payer: Self-pay | Admitting: Family Medicine

## 2021-12-18 MED ORDER — ALPRAZOLAM 0.25 MG PO TABS: ORAL_TABLET | ORAL | 0 refills | Status: DC

## 2021-12-18 NOTE — Telephone Encounter (Signed)
Patient came by the office need med refill ALPRAZolam (XANAX) 0.25 MG tablet [facility] [address] [place]

## 2021-12-18 NOTE — Telephone Encounter (Signed)
Please advise 

## 2022-01-20 ENCOUNTER — Other Ambulatory Visit: Payer: Self-pay | Admitting: Family Medicine

## 2022-02-12 DIAGNOSIS — E063 Autoimmune thyroiditis: Secondary | ICD-10-CM | POA: Diagnosis not present

## 2022-02-12 DIAGNOSIS — E038 Other specified hypothyroidism: Secondary | ICD-10-CM | POA: Diagnosis not present

## 2022-02-13 LAB — TSH: TSH: 0.666 u[IU]/mL (ref 0.450–4.500)

## 2022-02-13 LAB — T4, FREE: Free T4: 1.59 ng/dL (ref 0.82–1.77)

## 2022-02-18 ENCOUNTER — Telehealth: Payer: Self-pay

## 2022-02-18 NOTE — Telephone Encounter (Signed)
Please call patient about her allergies. Her Eyes are really bad. ?

## 2022-02-19 ENCOUNTER — Ambulatory Visit (INDEPENDENT_AMBULATORY_CARE_PROVIDER_SITE_OTHER): Payer: BC Managed Care – PPO | Admitting: "Endocrinology

## 2022-02-19 ENCOUNTER — Other Ambulatory Visit: Payer: Self-pay | Admitting: "Endocrinology

## 2022-02-19 ENCOUNTER — Encounter: Payer: Self-pay | Admitting: "Endocrinology

## 2022-02-19 VITALS — HR 72 | Ht 63.0 in | Wt 116.0 lb

## 2022-02-19 DIAGNOSIS — E038 Other specified hypothyroidism: Secondary | ICD-10-CM

## 2022-02-19 DIAGNOSIS — E063 Autoimmune thyroiditis: Secondary | ICD-10-CM | POA: Diagnosis not present

## 2022-02-19 MED ORDER — LEVOTHYROXINE SODIUM 125 MCG PO TABS: 62.5000 ug | ORAL_TABLET | Freq: Every day | ORAL | 1 refills | Status: DC

## 2022-02-19 NOTE — Telephone Encounter (Signed)
Has been using otc eye drops recommended by pharmacy and will call back if she needs her prednsione shot  ?
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
## 2022-03-19 ENCOUNTER — Encounter: Payer: Self-pay | Admitting: Family Medicine

## 2022-03-19 ENCOUNTER — Ambulatory Visit (INDEPENDENT_AMBULATORY_CARE_PROVIDER_SITE_OTHER): Payer: BC Managed Care – PPO | Admitting: Family Medicine

## 2022-03-19 VITALS — BP 113/76 | HR 80 | Ht 62.0 in | Wt 115.1 lb

## 2022-03-19 DIAGNOSIS — J3089 Other allergic rhinitis: Secondary | ICD-10-CM | POA: Diagnosis not present

## 2022-03-19 DIAGNOSIS — E785 Hyperlipidemia, unspecified: Secondary | ICD-10-CM

## 2022-03-19 DIAGNOSIS — E038 Other specified hypothyroidism: Secondary | ICD-10-CM

## 2022-03-19 DIAGNOSIS — F17200 Nicotine dependence, unspecified, uncomplicated: Secondary | ICD-10-CM

## 2022-03-19 DIAGNOSIS — Z1231 Encounter for screening mammogram for malignant neoplasm of breast: Secondary | ICD-10-CM

## 2022-03-19 DIAGNOSIS — F419 Anxiety disorder, unspecified: Secondary | ICD-10-CM

## 2022-03-19 DIAGNOSIS — F5104 Psychophysiologic insomnia: Secondary | ICD-10-CM

## 2022-03-19 DIAGNOSIS — E063 Autoimmune thyroiditis: Secondary | ICD-10-CM

## 2022-03-19 DIAGNOSIS — E559 Vitamin D deficiency, unspecified: Secondary | ICD-10-CM

## 2022-03-19 MED ORDER — AZELASTINE HCL 0.1 % NA SOLN: 2.0000 | Freq: Two times a day (BID) | NASAL | 12 refills | Status: DC

## 2022-03-19 MED ORDER — PREDNISONE 10 MG PO TABS: 10.0000 mg | ORAL_TABLET | Freq: Two times a day (BID) | ORAL | 0 refills | Status: DC

## 2022-03-19 MED ORDER — METHYLPREDNISOLONE ACETATE 80 MG/ML IJ SUSP
80.0000 mg | Freq: Once | INTRAMUSCULAR | Status: AC
  Administered 2022-03-19: 80 mg via INTRAMUSCULAR

## 2022-03-19 NOTE — Assessment & Plan Note (Signed)
Improved ?Sleep hygiene reviewed and written information offered also. ?Prescription sent for  medication needed. ? ?

## 2022-03-19 NOTE — Assessment & Plan Note (Signed)
Improved, xanax limited to 8/month, want to quit this  ?

## 2022-03-19 NOTE — Assessment & Plan Note (Signed)
Stable  and managed by Endo ?

## 2022-03-19 NOTE — Patient Instructions (Addendum)
Annual exam in October, call if you need me sooner ? ?Fasting CBC, lipid, cmp and eGFr and Vit D 1 week before October visit ? ?Please schedule mammogram at checkout ? ?Please continue to consider the need to stop smoking for improved health ? ?Depo Medrol 80 mg IM in the office today and 5 day course of prednisone is prescribed ?New to take daily d for allergies is Astelin nose spray, continue zyrtec and saline flushes ? ?It is important that you exercise regularly at least 30 minutes 5 times a week. If you develop chest pain, have severe difficulty breathing, or feel very tired, stop exercising immediately and seek medical attention  ? ?Work on low fat diet ? ?Thanks for choosing [facility], we consider it a privelige to serve you. ? ? ? ?

## 2022-03-19 NOTE — Progress Notes (Signed)
? ?  Carrie Perez     MRN: [id number]      DOB: [birth year] ? ? ?HPI ?[name] is here for follow up and re-evaluation of chronic medical conditions, medication management and review of any available recent lab and radiology data.  ?Preventive health is updated, specifically  Cancer screening and Immunization.   ?Questions or concerns regarding consultations or procedures which the PT has had in the interim are  addressed. ?The PT denies any adverse reactions to current medications since the last visit.  ?1 week h/o increased and uncontrolled allergy symptoms, drainage, clear, watery eyes, sneezing and scratchy throat from post nasal drip. Npo fever , chills or sputum ?ROS ?Marland Kitchen ?Denies chest pains, palpitations and leg swelling ?Denies abdominal pain, nausea, vomiting,diarrhea or constipation.   ?Denies dysuria, frequency, hesitancy or incontinence. ?Denies joint pain, swelling and limitation in mobility. ?Denies headaches, seizures, numbness, or tingling. ?Denies depression, anxiety or insomnia.Uses xana twice weekly sand remeron 3 times weekly, improved ?Denies skin break down or rash. ? ? ?PE ? ?BP 113/76   Pulse 80   Ht '5\' 2"'$  (1.575 m)   Wt 115 lb 1.9 oz (52.2 kg)   LMP 11/30/2014   SpO2 98%   BMI 21.06 kg/m?  ? ?Patient alert and oriented and in no cardiopulmonary distress. ? ?HEENT: No facial asymmetry, EOMI,     Neck supple .nasal mucosa erythematous, no sinus tendernessChest: Clear to auscultation bilaterally. ? ?CVS: S1, S2 no murmurs, no S3.Regular rate. ? ?ABD: Soft non tender.  ? ?Ext: No edema ? ?MS: Adequate ROM spine, shoulders, hips and knees. ? ?Skin: Intact, no ulcerations or rash noted. ? ?Psych: Good eye contact, normal affect. Memory intact not anxious or depressed appearing. ? ?CNS: CN 2-12 intact, power,  normal throughout.no focal deficits noted. ? ? ?Assessment & Plan ? ?Allergic rhinitis ?Uncontrolled, depo medrol, followed by prednisone , add astellin ? ?Current  smoker ?Asked:confirms currently smokes cigarettes 7/day ?Assess: Unwilling to set a quit date, but is cutting back ?Advise: needs to QUIT to reduce risk of cancer, cardio and cerebrovascular disease ?Assist: counseled for 5 minutes and literature provided ?Arrange: follow up in 2 to 4 months ? ? ?Hypothyroidism due to Hashimoto's thyroiditis ?Stable  and managed by Endo ? ?Insomnia ?Improved ?Sleep hygiene reviewed and written information offered also. ?Prescription sent for  medication needed. ? ? ?Anxiety ?Improved, xanax limited to 8/month, want to quit this  ? ?

## 2022-03-19 NOTE — Assessment & Plan Note (Signed)
Uncontrolled, depo medrol, followed by prednisone , add astellin ?

## 2022-03-19 NOTE — Assessment & Plan Note (Signed)
Asked:confirms currently smokes cigarettes 7/day Assess: Unwilling to set a quit date, but is cutting back Advise: needs to QUIT to reduce risk of cancer, cardio and cerebrovascular disease Assist: counseled for 5 minutes and literature provided Arrange: follow up in 2 to 4 months  

## 2022-04-03 ENCOUNTER — Emergency Department (HOSPITAL_COMMUNITY)
Admission: EM | Admit: 2022-04-03 | Discharge: 2022-04-03 | Disposition: A | Discharge status: Home / Self Care | Payer: BC Managed Care – PPO | Attending: Emergency Medicine | Admitting: Emergency Medicine

## 2022-04-03 ENCOUNTER — Encounter (HOSPITAL_COMMUNITY): Payer: Self-pay | Admitting: *Deleted

## 2022-04-03 ENCOUNTER — Emergency Department (HOSPITAL_COMMUNITY): Payer: BC Managed Care – PPO

## 2022-04-03 ENCOUNTER — Other Ambulatory Visit: Payer: Self-pay

## 2022-04-03 DIAGNOSIS — M79662 Pain in left lower leg: Secondary | ICD-10-CM | POA: Diagnosis not present

## 2022-04-03 DIAGNOSIS — M79602 Pain in left arm: Secondary | ICD-10-CM | POA: Diagnosis not present

## 2022-04-03 DIAGNOSIS — S8992XA Unspecified injury of left lower leg, initial encounter: Secondary | ICD-10-CM | POA: Diagnosis not present

## 2022-04-03 DIAGNOSIS — S8012XA Contusion of left lower leg, initial encounter: Secondary | ICD-10-CM | POA: Diagnosis not present

## 2022-04-03 DIAGNOSIS — Y92410 Unspecified street and highway as the place of occurrence of the external cause: Secondary | ICD-10-CM | POA: Diagnosis not present

## 2022-04-03 DIAGNOSIS — S40022A Contusion of left upper arm, initial encounter: Secondary | ICD-10-CM | POA: Diagnosis not present

## 2022-04-03 DIAGNOSIS — M79603 Pain in arm, unspecified: Secondary | ICD-10-CM | POA: Diagnosis not present

## 2022-04-03 MED ORDER — CYCLOBENZAPRINE HCL 10 MG PO TABS: 10.0000 mg | ORAL_TABLET | Freq: Every day | ORAL | 0 refills | Status: AC
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
## 2022-04-20 ENCOUNTER — Other Ambulatory Visit: Payer: Self-pay | Admitting: Family Medicine

## 2022-05-20 ENCOUNTER — Other Ambulatory Visit: Payer: Self-pay | Admitting: Family Medicine

## 2022-06-04 ENCOUNTER — Telehealth: Payer: Self-pay | Admitting: "Endocrinology

## 2022-06-04 DIAGNOSIS — E038 Other specified hypothyroidism: Secondary | ICD-10-CM

## 2022-06-04 MED ORDER — LEVOTHYROXINE SODIUM 125 MCG PO TABS: 62.5000 ug | ORAL_TABLET | Freq: Every day | ORAL | 1 refills | Status: DC

## 2022-06-04 NOTE — Telephone Encounter (Signed)
Patient is requesting a new RX for her levothyroxine (SYNTHROID) 125 MCG tablet. [place].

## 2022-06-04 NOTE — Telephone Encounter (Signed)
Rx Sent  

## 2022-06-19 ENCOUNTER — Telehealth: Payer: Self-pay | Admitting: Family Medicine

## 2022-06-19 ENCOUNTER — Other Ambulatory Visit: Payer: Self-pay | Admitting: Internal Medicine

## 2022-06-19 DIAGNOSIS — F419 Anxiety disorder, unspecified: Secondary | ICD-10-CM

## 2022-06-19 MED ORDER — ALPRAZOLAM 0.25 MG PO TABS: 0.2500 mg | ORAL_TABLET | Freq: Every evening | ORAL | 0 refills | Status: DC | PRN

## 2022-06-19 NOTE — Telephone Encounter (Signed)
Pls advise if able to refill

## 2022-06-19 NOTE — Telephone Encounter (Signed)
Pt called stating that she is completely out of ALPRAZolam (XANAX) 0.25 MG tablet. She is wanting to know if you can please refill?     [facility] [address]     Please call pt when sent, can leave vm she works 12 hr shifts & may be asleep

## 2022-06-19 NOTE — Telephone Encounter (Signed)
Pt aware.

## 2022-07-01 ENCOUNTER — Encounter: Payer: Self-pay | Admitting: Family Medicine

## 2022-07-01 ENCOUNTER — Ambulatory Visit: Payer: BC Managed Care – PPO | Admitting: Family Medicine

## 2022-07-01 VITALS — BP 137/69 | HR 67 | Ht 62.0 in | Wt 107.0 lb

## 2022-07-01 DIAGNOSIS — K219 Gastro-esophageal reflux disease without esophagitis: Secondary | ICD-10-CM

## 2022-07-01 NOTE — Patient Instructions (Addendum)
I appreciate the opportunity to provide care to you today!    GERD  Quitting smoking - Saliva helps to neutralize refluxed acid, and smoking reduces the amount of saliva in the mouth and throat. Smoking also lowers the pressure in the lower esophageal sphincter and provokes coughing, causing frequent episodes of acid reflux in the esophagus. In addition to having many other health benefits, quitting smoking can reduce or eliminate symptoms of mild reflux.   Avoid certain foods and drinks, such as coffee, chocolate, onions, peppermint, spicy foods, carbonated beverages, citrus fruits, tomatoes, onions, Garlic, alcohol, Fatty foods (bacon, burgers, sausages, steak, fried foods, dairy food)  Recommended: High fiber foods, whole grain cereal, oatmeal, brown rice, root vegetables, non- citrus fruits, High protein foods, Health fats (avocados, olive oil, nuts and seeds)   Referrals today- GI   Please continue to a heart-healthy diet and increase your physical activities. Try to exercise for 37mns at least three times a week.      It was a pleasure to see you and I look forward to continuing to work together on your health and well-being. Please do not hesitate to call the office if you need care or have questions about your care.   Have a wonderful day and week. With Gratitude, GAlvira MondayMSN, FNP-BC

## 2022-07-01 NOTE — Assessment & Plan Note (Signed)
Encouraged smoking cessation Encouraged to continue taking Protonix for GERD  Informed that Remeron is an appetite stimulant and to continue taking medication at bedtime Referral placed to GI for further evaluation

## 2022-07-01 NOTE — Progress Notes (Signed)
   Acute Office Visit  Subjective:     Patient ID: Carrie Perez, female    DOB: 26-Jan-1961, 61 y.o.   MRN: [id number]  Chief Complaint  Patient presents with   Heartburn    Pt c/o heartburn sensation, and back burning thinks its due to anxiety.     HPI The patient is in today with c/o of a burning sensation in her throat and mid back. GERD: Chronic. Reports medication adherence to Protonix and has been decreasing her intake of fatty and acidic foods, but symptoms have not relented. C/o of a burning sensation in her chest and mid back with regurgitation. She reports decreased appetite since symptom onset. She denies dysphagia and a feeling of a lump in her throat. She smokes 1 pack for 2 days.    Review of Systems  Constitutional:  Negative for chills and fever.  HENT:  Negative for congestion, sinus pain and sore throat.   Respiratory:  Negative for cough.   Gastrointestinal:  Positive for abdominal pain and heartburn. Negative for diarrhea, nausea and vomiting.        Objective:    BP 137/69   Pulse 67   Ht '5\' 2"'$  (1.575 m)   Wt 107 lb (48.5 kg)   LMP 11/30/2014   SpO2 98%   BMI 19.57 kg/m    Physical Exam HENT:     Head: Normocephalic.  Cardiovascular:     Rate and Rhythm: Normal rate and regular rhythm.     Pulses: Normal pulses.     Heart sounds: Normal heart sounds.  Pulmonary:     Effort: Pulmonary effort is normal.     Breath sounds: Normal breath sounds.  Abdominal:     General: Abdomen is flat. There is no distension.     Tenderness: There is abdominal tenderness in the epigastric area. There is no right CVA tenderness, left CVA tenderness, guarding or rebound.  Neurological:     Mental Status: She is alert.     No results found for any visits on 07/01/22.      Assessment & Plan:   Problem List Items Addressed This Visit       Digestive   GERD (gastroesophageal reflux disease) - Primary    Encouraged smoking cessation Encouraged to  continue taking Protonix for GERD  Informed that Remeron is an appetite stimulant and to continue taking medication at bedtime Referral placed to GI for further evaluation      Relevant Medications   pantoprazole (PROTONIX) 20 MG tablet   Other Relevant Orders   Ambulatory referral to Gastroenterology    No orders of the defined types were placed in this encounter.   Return if symptoms worsen or fail to improve.  Alvira Monday, FNP

## 2022-07-02 ENCOUNTER — Encounter (INDEPENDENT_AMBULATORY_CARE_PROVIDER_SITE_OTHER): Payer: Self-pay | Admitting: *Deleted

## 2022-07-29 ENCOUNTER — Encounter: Payer: Self-pay | Admitting: Internal Medicine

## 2022-07-29 ENCOUNTER — Ambulatory Visit (INDEPENDENT_AMBULATORY_CARE_PROVIDER_SITE_OTHER): Payer: BC Managed Care – PPO | Admitting: Internal Medicine

## 2022-07-29 VITALS — BP 118/78 | HR 65 | Resp 18 | Ht 62.0 in | Wt 102.2 lb

## 2022-07-29 DIAGNOSIS — E038 Other specified hypothyroidism: Secondary | ICD-10-CM | POA: Diagnosis not present

## 2022-07-29 DIAGNOSIS — K219 Gastro-esophageal reflux disease without esophagitis: Secondary | ICD-10-CM | POA: Diagnosis not present

## 2022-07-29 DIAGNOSIS — N3000 Acute cystitis without hematuria: Secondary | ICD-10-CM | POA: Diagnosis not present

## 2022-07-29 DIAGNOSIS — R339 Retention of urine, unspecified: Secondary | ICD-10-CM

## 2022-07-29 DIAGNOSIS — E063 Autoimmune thyroiditis: Secondary | ICD-10-CM | POA: Diagnosis not present

## 2022-07-29 LAB — POCT URINALYSIS DIP (CLINITEK)
Bilirubin, UA: NEGATIVE
Glucose, UA: NEGATIVE mg/dL
Leukocytes, UA: NEGATIVE
Nitrite, UA: NEGATIVE
POC PROTEIN,UA: 30 — AB
Spec Grav, UA: 1.025 (ref 1.010–1.025)
Urobilinogen, UA: 0.2 E.U./dL
pH, UA: 6.5 (ref 5.0–8.0)

## 2022-07-29 MED ORDER — PANTOPRAZOLE SODIUM 40 MG PO TBEC: 40.0000 mg | DELAYED_RELEASE_TABLET | Freq: Every day | ORAL | 3 refills | Status: DC

## 2022-07-29 MED ORDER — SULFAMETHOXAZOLE-TRIMETHOPRIM 800-160 MG PO TABS: 1.0000 | ORAL_TABLET | Freq: Two times a day (BID) | ORAL | 0 refills | Status: DC
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
## 2022-07-29 NOTE — Assessment & Plan Note (Addendum)
Uncontrolled with pantoprazole 20 mg daily Increased dose to 40 mg to be Needs to avoid hot and spicy food and caffeinated products Needs to cut down -> quit smoking

## 2022-07-29 NOTE — Patient Instructions (Addendum)
Please take Pantoprazole 40 mg for acid reflux.  Please take Bactrim for UTI.  Please increase fluid intake to at least 64 ounces of fluid in a day.

## 2022-07-30 LAB — T4, FREE: Free T4: 1.3 ng/dL (ref 0.82–1.77)

## 2022-07-30 LAB — TSH: TSH: 2.39 u[IU]/mL (ref 0.450–4.500)

## 2022-08-02 ENCOUNTER — Ambulatory Visit (HOSPITAL_COMMUNITY)
Admission: RE | Admit: 2022-08-02 | Discharge: 2022-08-02 | Disposition: A | Discharge status: Home / Self Care | Payer: BC Managed Care – PPO | Source: Ambulatory Visit | Attending: Family Medicine | Admitting: Family Medicine

## 2022-08-02 DIAGNOSIS — Z1231 Encounter for screening mammogram for malignant neoplasm of breast: Secondary | ICD-10-CM | POA: Diagnosis not present

## 2022-08-05 ENCOUNTER — Ambulatory Visit (INDEPENDENT_AMBULATORY_CARE_PROVIDER_SITE_OTHER): Payer: BC Managed Care – PPO | Admitting: "Endocrinology

## 2022-08-05 ENCOUNTER — Encounter: Payer: Self-pay | Admitting: "Endocrinology

## 2022-08-05 VITALS — BP 118/58 | HR 64 | Ht 62.0 in | Wt 101.6 lb

## 2022-08-05 DIAGNOSIS — E782 Mixed hyperlipidemia: Secondary | ICD-10-CM

## 2022-08-05 DIAGNOSIS — E063 Autoimmune thyroiditis: Secondary | ICD-10-CM | POA: Diagnosis not present

## 2022-08-05 DIAGNOSIS — E038 Other specified hypothyroidism: Secondary | ICD-10-CM | POA: Diagnosis not present

## 2022-08-05 DIAGNOSIS — F17200 Nicotine dependence, unspecified, uncomplicated: Secondary | ICD-10-CM
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
## 2022-08-21 ENCOUNTER — Ambulatory Visit: Payer: BC Managed Care – PPO | Admitting: "Endocrinology

## 2022-08-21 ENCOUNTER — Other Ambulatory Visit: Payer: Self-pay | Admitting: "Endocrinology

## 2022-08-21 DIAGNOSIS — E038 Other specified hypothyroidism: Secondary | ICD-10-CM

## 2022-08-27 ENCOUNTER — Telehealth: Payer: Self-pay

## 2022-08-27 NOTE — Telephone Encounter (Signed)
Patient needs refill alprazolam (Xanax) 0.25 mg [place] if get a refill on this medicine.  Pharmacy  [facility], [place], [place] [address]  Phone:  [phone]  Fax:  [phone]  DEA #:  [id number]

## 2022-08-28 ENCOUNTER — Other Ambulatory Visit: Payer: Self-pay | Admitting: Family Medicine

## 2022-08-28 MED ORDER — ALPRAZOLAM 0.25 MG PO TABS: ORAL_TABLET | ORAL | 0 refills | Status: DC

## 2022-08-28 NOTE — Telephone Encounter (Signed)
Patient called in again in regard to medicine.  Patient wants a call back when med is sent to pharm.

## 2022-08-28 NOTE — Telephone Encounter (Signed)
Patient aware.

## 2022-08-28 NOTE — Telephone Encounter (Signed)
Forwarded to Dr Moshe Cipro.

## 2022-09-16 ENCOUNTER — Ambulatory Visit (INDEPENDENT_AMBULATORY_CARE_PROVIDER_SITE_OTHER): Payer: BC Managed Care – PPO | Admitting: Gastroenterology

## 2022-10-07 ENCOUNTER — Ambulatory Visit: Payer: BC Managed Care – PPO | Admitting: "Endocrinology

## 2022-10-08 DIAGNOSIS — E782 Mixed hyperlipidemia: Secondary | ICD-10-CM | POA: Diagnosis not present

## 2022-10-08 DIAGNOSIS — E063 Autoimmune thyroiditis: Secondary | ICD-10-CM | POA: Diagnosis not present

## 2022-10-08 DIAGNOSIS — E038 Other specified hypothyroidism: Secondary | ICD-10-CM | POA: Diagnosis not present

## 2022-10-09 LAB — TSH: TSH: 0.535 u[IU]/mL (ref 0.450–4.500)

## 2022-10-09 LAB — CORTISOL-AM, BLOOD: Cortisol - AM: 11.3 ug/dL (ref 6.2–19.4)

## 2022-10-09 LAB — LIPID PANEL
Chol/HDL Ratio: 3.4 ratio (ref 0.0–4.4)
Cholesterol, Total: 249 mg/dL — ABNORMAL HIGH (ref 100–199)
HDL: 74 mg/dL (ref >39)
LDL Chol Calc (NIH): 164 mg/dL — ABNORMAL HIGH (ref 0–99)
Triglycerides: 68 mg/dL (ref 0–149)
VLDL Cholesterol Cal: 11 mg/dL (ref 5–40)

## 2022-10-09 LAB — T4, FREE: Free T4: 1.12 ng/dL (ref 0.82–1.77)

## 2022-10-17 ENCOUNTER — Encounter: Payer: Self-pay | Admitting: Family Medicine

## 2022-10-17 ENCOUNTER — Ambulatory Visit (INDEPENDENT_AMBULATORY_CARE_PROVIDER_SITE_OTHER): Payer: BC Managed Care – PPO | Admitting: Family Medicine

## 2022-10-17 VITALS — BP 123/74 | HR 67 | Ht 63.0 in | Wt 110.0 lb

## 2022-10-17 DIAGNOSIS — L239 Allergic contact dermatitis, unspecified cause: Secondary | ICD-10-CM

## 2022-10-17 DIAGNOSIS — F17200 Nicotine dependence, unspecified, uncomplicated: Secondary | ICD-10-CM | POA: Diagnosis not present

## 2022-10-17 DIAGNOSIS — J3089 Other allergic rhinitis: Secondary | ICD-10-CM | POA: Diagnosis not present

## 2022-10-17 DIAGNOSIS — E559 Vitamin D deficiency, unspecified: Secondary | ICD-10-CM

## 2022-10-17 DIAGNOSIS — Z0000 Encounter for general adult medical examination without abnormal findings: Secondary | ICD-10-CM

## 2022-10-17 DIAGNOSIS — K219 Gastro-esophageal reflux disease without esophagitis: Secondary | ICD-10-CM | POA: Diagnosis not present

## 2022-10-17 DIAGNOSIS — E785 Hyperlipidemia, unspecified: Secondary | ICD-10-CM | POA: Diagnosis not present

## 2022-10-17 MED ORDER — BETAMETHASONE DIPROPIONATE 0.05 % EX CREA: TOPICAL_CREAM | Freq: Two times a day (BID) | CUTANEOUS | 1 refills | Status: DC

## 2022-10-17 MED ORDER — METHYLPREDNISOLONE ACETATE 80 MG/ML IJ SUSP
80.0000 mg | Freq: Once | INTRAMUSCULAR | Status: AC
  Administered 2022-10-17: 80 mg via INTRAMUSCULAR

## 2022-10-17 MED ORDER — PREDNISONE 10 MG PO TABS: 10.0000 mg | ORAL_TABLET | Freq: Two times a day (BID) | ORAL | 0 refills | Status: DC

## 2022-10-17 MED ORDER — PANTOPRAZOLE SODIUM 40 MG PO TBEC: 40.0000 mg | DELAYED_RELEASE_TABLET | Freq: Every day | ORAL | 5 refills | Status: DC

## 2022-10-17 NOTE — Assessment & Plan Note (Signed)
Asked:confirms currently smokes cigarettes °Assess: Unwilling to set a quit date, but is cutting back °Advise: needs to QUIT to reduce risk of cancer, cardio and cerebrovascular disease °Assist: counseled for 5 minutes and literature provided °Arrange: follow up in 2 to 4 months ° °

## 2022-10-17 NOTE — Patient Instructions (Addendum)
Follow-up in 6 months, call if you need me sooner.  Depo-Medrol 80 mg IM in office today for allergies and a 5-day course of prednisone is prescribed.  Topical cream is prescribed for eczema on your left neck.  Please get labs at [facility] today  ( across the street) hepatic panel vitamin D and CBC.  You continue to smoke 3 cigarettes breaths daily the goal is to actually quit and I recommended that you work towards this.  You have been referred for evaluation of severe reflux and at your request appointment is requested for April it is important that you follow through with this.  Thanks for choosing [facility], we consider it a privelige to serve you.

## 2022-10-17 NOTE — Assessment & Plan Note (Signed)
Uncontrolled, depo medrol 80 mg Im in office followed by short course of prednisone

## 2022-10-17 NOTE — Assessment & Plan Note (Signed)
Annual exam as documented. . Immunization and cancer screening needs are specifically addressed at this visit.  

## 2022-10-17 NOTE — Progress Notes (Signed)
    Carrie Perez     MRN: [id number]      DOB: Feb 10, 1961  HPI: Patient is in for annual physical exam. C/o uncontrolled allergies , wants steroid for this, also c/o itchy rash on left side of neck, wants cream for this which has helped to control it. Uses on avg 2 xanax per month, has about 20 tabs therefore should last at least 6 months Still smoking 3 ciggs / day, unwilling to set a quit date Immunization is reviewed , and  updated if needed. Relies on daily PPI and has not had GI eval, agrees to  go in April 2024, states insurance coverage an issue   PE: BP 123/74 (BP Location: Right Arm, Patient Position: Sitting, Cuff Size: Normal)   Pulse 67   Ht '5\' 3"'$  (1.6 m)   Wt 110 lb (49.9 kg)   LMP 11/30/2014   SpO2 97%   BMI 19.49 kg/m   Pleasant  female, alert and oriented x 3, in no cardio-pulmonary distress. Afebrile. HEENT No facial trauma or asymetry. Sinuses non tender. Nasal congestion and watery eyes on exam Extra occullar muscles intact.. External ears normal, . Neck: supple, no adenopathy,JVD or thyromegaly.No bruits.  Chest: Clear to ascultation bilaterally.No crackles or wheezes. Non tender to palpation  Cardiovascular system; Heart sounds normal,  S1 and  S2 ,no S3.  No murmur, or thrill.  Peripheral pulses normal.  Abdomen: Soft, non tender, no organomegaly or masses. No bruits. Bowel sounds normal. No guarding, tenderness or rebound.   GU: External genitalia normal female genitalia , normal female distribution of hair. No lesions. Urethral meatus normal in size, no  Prolapse, no lesions visibly  Present. Bladder non tender. Vagina pink and moist , with no visible lesions , discharge present . Adequate pelvic support no  cystocele or rectocele noted Cervix pink and appears healthy, no lesions or ulcerations noted, no discharge noted from os Uterus normal size, no adnexal masses, no cervical motion or adnexal tenderness.   Musculoskeletal  exam: Full ROM of spine, hips , shoulders and knees. No deformity ,swelling or crepitus noted. No muscle wasting or atrophy.   Neurologic: Cranial nerves 2 to 12 intact. Power, tone ,sensation and reflexes normal throughout. No disturbance in gait. No tremor.  Skin: Intact, no ulceration, erythema , scaling or rash noted. Pigmentation normal throughout  Psych; Normal mood and affect. Judgement and concentration normal   Assessment & Plan:  GERD (gastroesophageal reflux disease) GERD requiring high diose PPI, refer GI  Encounter for annual physical exam Annual exam as documented. Immunization and cancer screening needs are specifically addressed at this visit.   Current smoker Asked:confirms currently smokes cigarettes Assess: Unwilling to set a quit date, but is cutting back Advise: needs to QUIT to reduce risk of cancer, cardio and cerebrovascular disease Assist: counseled for 5 minutes and literature provided Arrange: follow up in 2 to 4 months   Allergic dermatitis Betamethasone twice daily for as needed use, current flare present on left neck, med prescribed  Allergic rhinitis Uncontrolled, depo medrol 80 mg Im in office followed by short course of prednisone

## 2022-10-17 NOTE — Assessment & Plan Note (Signed)
Betamethasone twice daily for as needed use, current flare present on left neck, med prescribed

## 2022-10-17 NOTE — Assessment & Plan Note (Signed)
GERD requiring high diose PPI, refer GI

## 2022-10-18 ENCOUNTER — Encounter (INDEPENDENT_AMBULATORY_CARE_PROVIDER_SITE_OTHER): Payer: Self-pay | Admitting: *Deleted

## 2022-10-18 LAB — VITAMIN D 25 HYDROXY (VIT D DEFICIENCY, FRACTURES): Vit D, 25-Hydroxy: 42.7 ng/mL (ref 30.0–100.0)

## 2022-10-18 LAB — CBC WITH DIFFERENTIAL/PLATELET
Basophils Absolute: 0 10*3/uL (ref 0.0–0.2)
Basos: 0 %
EOS (ABSOLUTE): 0.1 10*3/uL (ref 0.0–0.4)
Eos: 2 %
Hematocrit: 40.4 % (ref 34.0–46.6)
Hemoglobin: 13.5 g/dL (ref 11.1–15.9)
Immature Grans (Abs): 0 10*3/uL (ref 0.0–0.1)
Immature Granulocytes: 0 %
Lymphocytes Absolute: 2.2 10*3/uL (ref 0.7–3.1)
Lymphs: 38 %
MCH: 30 pg (ref 26.6–33.0)
MCHC: 33.4 g/dL (ref 31.5–35.7)
MCV: 90 fL (ref 79–97)
Monocytes Absolute: 0.3 10*3/uL (ref 0.1–0.9)
Monocytes: 6 %
Neutrophils Absolute: 3.1 10*3/uL (ref 1.4–7.0)
Neutrophils: 54 %
Platelets: 279 10*3/uL (ref 150–450)
RBC: 4.5 x10E6/uL (ref 3.77–5.28)
RDW: 13.3 % (ref 11.7–15.4)
WBC: 5.8 10*3/uL (ref 3.4–10.8)

## 2022-10-18 LAB — HEPATIC FUNCTION PANEL
ALT: 8 IU/L (ref 0–32)
AST: 16 IU/L (ref 0–40)
Albumin: 4.3 g/dL (ref 3.8–4.9)
Alkaline Phosphatase: 80 IU/L (ref 44–121)
Bilirubin Total: 0.6 mg/dL (ref 0.0–1.2)
Bilirubin, Direct: 0.15 mg/dL (ref 0.00–0.40)
Total Protein: 7 g/dL (ref 6.0–8.5)

## 2022-10-22 ENCOUNTER — Telehealth: Payer: Self-pay | Admitting: "Endocrinology

## 2022-10-22 NOTE — Telephone Encounter (Signed)
Patient asked if you could use her labs from November for her 12/29 appt

## 2022-10-23 NOTE — Telephone Encounter (Signed)
Left vm

## 2022-11-15 ENCOUNTER — Ambulatory Visit: Payer: BC Managed Care – PPO | Admitting: "Endocrinology

## 2022-11-25 ENCOUNTER — Encounter: Payer: Self-pay | Admitting: "Endocrinology

## 2022-11-25 ENCOUNTER — Ambulatory Visit (INDEPENDENT_AMBULATORY_CARE_PROVIDER_SITE_OTHER): Payer: BC Managed Care – PPO | Admitting: "Endocrinology

## 2022-11-25 VITALS — BP 118/76 | HR 72 | Ht 63.0 in | Wt 110.0 lb

## 2022-11-25 DIAGNOSIS — E038 Other specified hypothyroidism: Secondary | ICD-10-CM

## 2022-11-25 DIAGNOSIS — E063 Autoimmune thyroiditis: Secondary | ICD-10-CM

## 2022-11-25 DIAGNOSIS — E782 Mixed hyperlipidemia: Secondary | ICD-10-CM

## 2022-11-25 MED ORDER — LEVOTHYROXINE SODIUM 125 MCG PO TABS: ORAL_TABLET | ORAL | 1 refills | Status: DC

## 2022-11-25 MED ORDER — ROSUVASTATIN CALCIUM 5 MG PO TABS: 5.0000 mg | ORAL_TABLET | Freq: Every day | ORAL | 1 refills | Status: DC
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
## 2022-12-19 ENCOUNTER — Ambulatory Visit (INDEPENDENT_AMBULATORY_CARE_PROVIDER_SITE_OTHER): Payer: BC Managed Care – PPO | Admitting: Gastroenterology

## 2023-01-18 ENCOUNTER — Other Ambulatory Visit: Payer: Self-pay | Admitting: Family Medicine

## 2023-01-20 ENCOUNTER — Other Ambulatory Visit: Payer: Self-pay | Admitting: Family Medicine

## 2023-04-21 ENCOUNTER — Telehealth: Payer: Self-pay | Admitting: "Endocrinology

## 2023-04-21 DIAGNOSIS — E038 Other specified hypothyroidism: Secondary | ICD-10-CM

## 2023-04-21 NOTE — Telephone Encounter (Signed)
Pt is requesting a refill on her Levothyroxine - [facility] [address]

## 2023-04-22 MED ORDER — LEVOTHYROXINE SODIUM 125 MCG PO TABS: ORAL_TABLET | ORAL | 1 refills | Status: DC

## 2023-04-22 NOTE — Telephone Encounter (Signed)
Rx Sent  

## 2023-04-29 ENCOUNTER — Encounter: Payer: Self-pay | Admitting: Family Medicine

## 2023-04-29 ENCOUNTER — Ambulatory Visit: Payer: BC Managed Care – PPO | Admitting: Family Medicine

## 2023-04-29 VITALS — BP 118/68 | HR 64 | Ht 63.0 in | Wt 115.1 lb

## 2023-04-29 DIAGNOSIS — J3089 Other allergic rhinitis: Secondary | ICD-10-CM

## 2023-04-29 DIAGNOSIS — F419 Anxiety disorder, unspecified: Secondary | ICD-10-CM | POA: Diagnosis not present

## 2023-04-29 DIAGNOSIS — Z1231 Encounter for screening mammogram for malignant neoplasm of breast: Secondary | ICD-10-CM

## 2023-04-29 DIAGNOSIS — L239 Allergic contact dermatitis, unspecified cause: Secondary | ICD-10-CM

## 2023-04-29 DIAGNOSIS — E785 Hyperlipidemia, unspecified: Secondary | ICD-10-CM

## 2023-04-29 DIAGNOSIS — E063 Autoimmune thyroiditis: Secondary | ICD-10-CM

## 2023-04-29 DIAGNOSIS — E038 Other specified hypothyroidism: Secondary | ICD-10-CM

## 2023-04-29 DIAGNOSIS — E559 Vitamin D deficiency, unspecified: Secondary | ICD-10-CM

## 2023-04-29 DIAGNOSIS — F5104 Psychophysiologic insomnia: Secondary | ICD-10-CM

## 2023-04-29 MED ORDER — PREDNISONE 10 MG PO TABS: 10.0000 mg | ORAL_TABLET | Freq: Two times a day (BID) | ORAL | 0 refills | Status: DC

## 2023-04-29 NOTE — Patient Instructions (Addendum)
Annual exam with pap in October, call if you need me sooner  CBC, cmp and EGFR and vit D 5 days before next appointment  No changes in medication, keep up the good work  It is important that you exercise regularly at least 30 minutes 5 times a week. If you develop chest pain, have severe difficulty breathing, or feel very tired, stop exercising immediately and seek medical attention    Please schedule mammogram due 08/04/2023    Thanks for choosing [facility], we consider it a privelige to serve you.

## 2023-05-05 ENCOUNTER — Encounter: Payer: Self-pay | Admitting: Family Medicine

## 2023-05-05 DIAGNOSIS — E559 Vitamin D deficiency, unspecified: Secondary | ICD-10-CM | POA: Insufficient documentation

## 2023-05-05 NOTE — Assessment & Plan Note (Signed)
Updated lab needed at/ before next visit.   

## 2023-05-05 NOTE — Progress Notes (Signed)
   Carrie Perez     MRN: [id number]      DOB: [birth year]  Chief Complaint  Patient presents with   Follow-up    Follow up    HPI Carrie Perez is here for follow up and re-evaluation of chronic medical conditions, medication management and review of any available recent lab and radiology data.  Preventive health is updated, specifically  Cancer screening and Immunization.   Questions or concerns regarding consultations or procedures which the PT has had in the interim are  addressed. The PT denies any adverse reactions to current medications since the last visit.  Anxiety and sleep are improved and currently on no medication States very busy helping everyone , has started 2nd job, realizes she needs to slow down, has stopped xanax and medication for sleep  ROS Denies recent fever or chills. Denies sinus pressure, nasal congestion, ear pain or sore throat. Denies chest congestion, productive cough or wheezing. Denies chest pains, palpitations and leg swelling Denies abdominal pain, nausea, vomiting,diarrhea or constipation.   Denies dysuria, frequency, hesitancy or incontinence. Denies joint pain, swelling and limitation in mobility. Denies headaches, seizures, numbness, or tingling. Denies depression, anxiety or insomnia. C/o itchy rash on left side of neck x 5 days PE  BP 118/68 (BP Location: Right Arm, Patient Position: Sitting, Cuff Size: Normal)   Pulse 64   Ht 5\' 3"  (1.6 m)   Wt 115 lb 1.3 oz (52.2 kg)   LMP 11/30/2014   SpO2 98%   BMI 20.39 kg/m   Patient alert and oriented and in no cardiopulmonary distress.  HEENT: No facial asymmetry, EOMI,     Neck supple .  Chest: Clear to auscultation bilaterally.  CVS: S1, S2 no murmurs, no S3.Regular rate.  ABD: Soft non tender.   Ext: No edema  MS: Adequate ROM spine, shoulders, hips and knees.  Skin: Intact, macular rash on left neck, no erythema or drainage Psych: Good eye contact, normal affect. Memory intact  not anxious or depressed appearing.  CNS: CN 2-12 intact, power,  normal throughout.no focal deficits noted.   Assessment & Plan  Hyperlipemia Hyperlipidemia:Low fat diet discussed and encouraged.   Lipid Panel  Lab Results  Component Value Date   CHOL 249 (H) 10/08/2022   HDL 74 10/08/2022   LDLCALC 164 (H) 10/08/2022   TRIG 68 10/08/2022   CHOLHDL 3.4 10/08/2022     Updated lab needed at/ before next visit.   Hypothyroidism due to Hashimoto's thyroiditis Managed by Endo and controlled  Insomnia Sleep hygiene reviewed and written information offered also.Controlled without medication  Allergic rhinitis Controlled, no change in medication   Allergic dermatitis Current flare , short course of prednisone is prescribed  Anxiety Improved , on no medication regularly

## 2023-05-05 NOTE — Assessment & Plan Note (Signed)
Controlled, no change in medication  

## 2023-05-05 NOTE — Assessment & Plan Note (Signed)
Improved , on no medication regularly

## 2023-05-05 NOTE — Assessment & Plan Note (Signed)
Managed by Endo and controlled 

## 2023-05-05 NOTE — Assessment & Plan Note (Signed)
Current flare, short course of prednisone is prescribed 

## 2023-05-05 NOTE — Assessment & Plan Note (Signed)
Hyperlipidemia:Low fat diet discussed and encouraged.   Lipid Panel  Lab Results  Component Value Date   CHOL 249 (H) 10/08/2022   HDL 74 10/08/2022   LDLCALC 164 (H) 10/08/2022   TRIG 68 10/08/2022   CHOLHDL 3.4 10/08/2022     Updated lab needed at/ before next visit.

## 2023-05-05 NOTE — Assessment & Plan Note (Signed)
Sleep hygiene reviewed and written information offered also.Controlled without medication

## 2023-05-20 DIAGNOSIS — E063 Autoimmune thyroiditis: Secondary | ICD-10-CM | POA: Diagnosis not present

## 2023-05-20 DIAGNOSIS — E782 Mixed hyperlipidemia: Secondary | ICD-10-CM | POA: Diagnosis not present

## 2023-05-20 DIAGNOSIS — E038 Other specified hypothyroidism: Secondary | ICD-10-CM | POA: Diagnosis not present

## 2023-05-21 LAB — LIPID PANEL
Chol/HDL Ratio: 2.7 ratio (ref 0.0–4.4)
Cholesterol, Total: 149 mg/dL (ref 100–199)
HDL: 56 mg/dL (ref >39)
LDL Chol Calc (NIH): 79 mg/dL (ref 0–99)
Triglycerides: 68 mg/dL (ref 0–149)
VLDL Cholesterol Cal: 14 mg/dL (ref 5–40)

## 2023-05-21 LAB — T4, FREE: Free T4: 1.45 ng/dL (ref 0.82–1.77)

## 2023-05-21 LAB — TSH: TSH: 0.074 u[IU]/mL — ABNORMAL LOW (ref 0.450–4.500)

## 2023-05-26 ENCOUNTER — Encounter: Payer: Self-pay | Admitting: "Endocrinology

## 2023-05-26 ENCOUNTER — Ambulatory Visit (INDEPENDENT_AMBULATORY_CARE_PROVIDER_SITE_OTHER): Payer: BC Managed Care – PPO | Admitting: "Endocrinology

## 2023-05-26 VITALS — BP 112/72 | HR 76 | Ht 63.0 in | Wt 112.0 lb

## 2023-05-26 DIAGNOSIS — E063 Autoimmune thyroiditis: Secondary | ICD-10-CM

## 2023-05-26 DIAGNOSIS — E782 Mixed hyperlipidemia: Secondary | ICD-10-CM | POA: Diagnosis not present

## 2023-05-26 DIAGNOSIS — E038 Other specified hypothyroidism: Secondary | ICD-10-CM

## 2023-05-26 MED ORDER — LEVOTHYROXINE SODIUM 50 MCG PO TABS: 50.0000 ug | ORAL_TABLET | Freq: Every day | ORAL | 1 refills | Status: DC

## 2023-05-26 MED ORDER — ROSUVASTATIN CALCIUM 5 MG PO TABS: 5.0000 mg | ORAL_TABLET | Freq: Every day | ORAL | 1 refills | Status: DC
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
## 2023-06-14 ENCOUNTER — Other Ambulatory Visit: Payer: Self-pay | Admitting: Family Medicine

## 2023-06-23 ENCOUNTER — Telehealth: Payer: Self-pay | Admitting: Family Medicine

## 2023-06-23 NOTE — Telephone Encounter (Signed)
Provider health screening form  Copied Noted Sleeved (put in provider box)

## 2023-09-17 ENCOUNTER — Ambulatory Visit (INDEPENDENT_AMBULATORY_CARE_PROVIDER_SITE_OTHER): Payer: BC Managed Care – PPO

## 2023-09-17 DIAGNOSIS — Z23 Encounter for immunization: Secondary | ICD-10-CM | POA: Diagnosis not present

## 2023-10-09 ENCOUNTER — Encounter (HOSPITAL_COMMUNITY): Payer: Self-pay

## 2023-10-09 ENCOUNTER — Ambulatory Visit (HOSPITAL_COMMUNITY)
Admission: RE | Admit: 2023-10-09 | Discharge: 2023-10-09 | Disposition: A | Discharge status: Home / Self Care | Payer: BC Managed Care – PPO | Source: Ambulatory Visit | Attending: Family Medicine | Admitting: Family Medicine

## 2023-10-09 DIAGNOSIS — Z1231 Encounter for screening mammogram for malignant neoplasm of breast: Secondary | ICD-10-CM | POA: Insufficient documentation

## 2023-10-14 ENCOUNTER — Telehealth: Payer: Self-pay

## 2023-10-14 NOTE — Telephone Encounter (Signed)
Copied from CRM [id number]. Topic: Clinical - Lab/Test Results >> Oct 14, 2023 11:04 AM Conni Elliot wrote: Reason for CRM: pt would like to go over mammogram results/has fasting questions

## 2023-10-15 DIAGNOSIS — E559 Vitamin D deficiency, unspecified: Secondary | ICD-10-CM | POA: Diagnosis not present

## 2023-10-15 DIAGNOSIS — E785 Hyperlipidemia, unspecified: Secondary | ICD-10-CM | POA: Diagnosis not present

## 2023-10-15 NOTE — Telephone Encounter (Signed)
Pt has been informed.

## 2023-10-16 LAB — CMP14+EGFR
ALT: 9 [IU]/L (ref 0–32)
AST: 14 [IU]/L (ref 0–40)
Albumin: 4.5 g/dL (ref 3.8–4.9)
Alkaline Phosphatase: 93 [IU]/L (ref 44–121)
BUN/Creatinine Ratio: 11 — ABNORMAL LOW (ref 12–28)
BUN: 10 mg/dL (ref 8–27)
Bilirubin Total: 0.9 mg/dL (ref 0.0–1.2)
CO2: 28 mmol/L (ref 20–29)
Calcium: 10 mg/dL (ref 8.7–10.3)
Chloride: 102 mmol/L (ref 96–106)
Creatinine, Ser: 0.93 mg/dL (ref 0.57–1.00)
Globulin, Total: 2.7 g/dL (ref 1.5–4.5)
Glucose: 103 mg/dL — ABNORMAL HIGH (ref 70–99)
Potassium: 4.2 mmol/L (ref 3.5–5.2)
Sodium: 140 mmol/L (ref 134–144)
Total Protein: 7.2 g/dL (ref 6.0–8.5)
eGFR: 70 mL/min/{1.73_m2} (ref >59)

## 2023-10-16 LAB — VITAMIN D 25 HYDROXY (VIT D DEFICIENCY, FRACTURES): Vit D, 25-Hydroxy: 38.9 ng/mL (ref 30.0–100.0)

## 2023-10-16 LAB — CBC
Hematocrit: 40.8 % (ref 34.0–46.6)
Hemoglobin: 13.4 g/dL (ref 11.1–15.9)
MCH: 29.5 pg (ref 26.6–33.0)
MCHC: 32.8 g/dL (ref 31.5–35.7)
MCV: 90 fL (ref 79–97)
Platelets: 240 10*3/uL (ref 150–450)
RBC: 4.55 x10E6/uL (ref 3.77–5.28)
RDW: 13.4 % (ref 11.7–15.4)
WBC: 5 10*3/uL (ref 3.4–10.8)

## 2023-10-21 ENCOUNTER — Ambulatory Visit (INDEPENDENT_AMBULATORY_CARE_PROVIDER_SITE_OTHER): Payer: BC Managed Care – PPO | Admitting: Family Medicine

## 2023-10-21 ENCOUNTER — Encounter: Payer: Self-pay | Admitting: Family Medicine

## 2023-10-21 VITALS — BP 126/75 | HR 89 | Ht 63.0 in | Wt 111.1 lb

## 2023-10-21 DIAGNOSIS — Z0001 Encounter for general adult medical examination with abnormal findings: Secondary | ICD-10-CM | POA: Diagnosis not present

## 2023-10-21 DIAGNOSIS — L239 Allergic contact dermatitis, unspecified cause: Secondary | ICD-10-CM

## 2023-10-21 DIAGNOSIS — F17210 Nicotine dependence, cigarettes, uncomplicated: Secondary | ICD-10-CM | POA: Insufficient documentation

## 2023-10-21 DIAGNOSIS — J3089 Other allergic rhinitis: Secondary | ICD-10-CM

## 2023-10-21 DIAGNOSIS — Z0000 Encounter for general adult medical examination without abnormal findings: Secondary | ICD-10-CM

## 2023-10-21 MED ORDER — METHYLPREDNISOLONE ACETATE 80 MG/ML IJ SUSP
40.0000 mg | Freq: Once | INTRAMUSCULAR | Status: AC
Start: 2023-10-21 — End: 2023-10-21
  Administered 2023-10-21: 40 mg via INTRAMUSCULAR

## 2023-10-21 MED ORDER — PREDNISONE 10 MG PO TABS: 10.0000 mg | ORAL_TABLET | Freq: Two times a day (BID) | ORAL | 0 refills | Status: DC

## 2023-10-21 NOTE — Assessment & Plan Note (Addendum)
Annual exam as documented.  Changes in health habits are decided on by the patient with goals and time frames  set for achieving them. Immunization and cancer screening needs are specifically addressed at this visit.  

## 2023-10-21 NOTE — Assessment & Plan Note (Signed)
Asked:confirms currently smokes cigarettes Assess: Unwilling to set a quit date,  Advise: needs to QUIT to reduce risk of cancer, cardio and cerebrovascular disease Assist: counseled for 5 minutes and literature provided Arrange: follow up in 2 to 4 months  

## 2023-10-21 NOTE — Progress Notes (Signed)
    KEYSHAWNA Perez     MRN: [id number]      DOB: 09/14/1961  Chief Complaint  Patient presents with   Annual Exam    CPE allergies    HPI: Patient is in for annual physical exam. 1 month h/o I  uncontrolled allergy symptoms, runny nose , sinus pressure, itchy rash on left  neck, requests steroids which help intermittently. Immunization is reviewed , and  she only needs the Covid vaccine  PE: BP 126/75 (BP Location: Right Arm, Patient Position: Sitting, Cuff Size: Normal)   Pulse 89   Ht 5\' 3"  (1.6 m)   Wt 111 lb 1.9 oz (50.4 kg)   LMP 11/30/2014   SpO2 96%   BMI 19.68 kg/m   Pleasant  female, alert and oriented x 3, in no cardio-pulmonary distress. Afebrile. HEENT No facial trauma or asymetry. Sinuses non tender.  Extra occullar muscles intact.. External ears normal, . Neck: supple, no adenopathy,JVD or thyromegaly.No bruits.  Chest: Clear to ascultation bilaterally.No crackles or wheezes. Non tender to palpation  Cardiovascular system; Heart sounds normal,  S1 and  S2 ,no S3.  No murmur, or thrill. Apical beat not displaced Peripheral pulses normal.  Abdomen: Soft, non tender, no organomegaly or masses. No bruits. Bowel sounds normal. No guarding, tenderness or rebound.   Musculoskeletal exam: Full ROM of spine, hips , shoulders and knees. No deformity ,swelling or crepitus noted. No muscle wasting or atrophy.   Neurologic: Cranial nerves 2 to 12 intact. Power, tone ,sensation and reflexes normal throughout. No disturbance in gait. No tremor.  Skin: Intact, hyperpigmented  rash noted.on left neck   Psych; Normal mood and affect. Judgement and concentration normal   Assessment & Plan:  Encounter for annual physical exam Annual exam as documented.  Changes in health habits are decided on by the patient with goals and time frames  set for achieving them. Immunization and cancer screening needs are specifically addressed at this  visit.   Allergic rhinitis Uncontrolled, depo medrol 40 mg IM and 5 day course of prednisone is prescribed  Allergic dermatitis Current flare left neck, steroids prescribed, uses topical steroids also  Cigarette smoker Asked:confirms currently smokes cigarettes Assess: Unwilling to set a quit date, Advise: needs to QUIT to reduce risk of cancer, cardio and cerebrovascular disease Assist: counseled for 5 minutes and literature provided Arrange: follow up in 2 to 4 months

## 2023-10-21 NOTE — Assessment & Plan Note (Signed)
Current flare left neck, steroids prescribed, uses topical steroids also

## 2023-10-21 NOTE — Patient Instructions (Addendum)
F/u in 6 months, call if you need ,me sooner  Exam and lab work  are good.  Cionsider covid vaccine, recommended  Depo-Medrol 40 mg IM in office today for uncontrolled allergies and a 5-day course of prednisone is also prescribed.  Please continue to work on quitting cigarettes for health reasons.  No changes in current medications.  Best for 2025.  Thanks for choosing [facility], we consider it a privelige to serve you.

## 2023-10-21 NOTE — Assessment & Plan Note (Signed)
Uncontrolled, depo medrol 40 mg IM and 5 day course of prednisone is prescribed

## 2023-11-20 DIAGNOSIS — E063 Autoimmune thyroiditis: Secondary | ICD-10-CM | POA: Diagnosis not present

## 2023-11-20 DIAGNOSIS — E782 Mixed hyperlipidemia: Secondary | ICD-10-CM | POA: Diagnosis not present

## 2023-11-20 DIAGNOSIS — E038 Other specified hypothyroidism: Secondary | ICD-10-CM | POA: Diagnosis not present

## 2023-11-21 LAB — LIPID PANEL
Chol/HDL Ratio: 2.6 {ratio} (ref 0.0–4.4)
Cholesterol, Total: 170 mg/dL (ref 100–199)
HDL: 65 mg/dL (ref >39)
LDL Chol Calc (NIH): 94 mg/dL (ref 0–99)
Triglycerides: 55 mg/dL (ref 0–149)
VLDL Cholesterol Cal: 11 mg/dL (ref 5–40)

## 2023-11-21 LAB — T4, FREE: Free T4: 1.13 ng/dL (ref 0.82–1.77)

## 2023-11-21 LAB — TSH: TSH: 2.54 u[IU]/mL (ref 0.450–4.500)

## 2023-11-26 ENCOUNTER — Ambulatory Visit (INDEPENDENT_AMBULATORY_CARE_PROVIDER_SITE_OTHER): Payer: BC Managed Care – PPO | Admitting: "Endocrinology

## 2023-11-26 ENCOUNTER — Encounter: Payer: Self-pay | Admitting: "Endocrinology

## 2023-11-26 VITALS — BP 110/58 | HR 68 | Ht 63.0 in | Wt 104.8 lb

## 2023-11-26 DIAGNOSIS — E063 Autoimmune thyroiditis: Secondary | ICD-10-CM | POA: Diagnosis not present

## 2023-11-26 DIAGNOSIS — F17200 Nicotine dependence, unspecified, uncomplicated: Secondary | ICD-10-CM | POA: Diagnosis not present

## 2023-11-26 DIAGNOSIS — E782 Mixed hyperlipidemia: Secondary | ICD-10-CM

## 2023-11-26 MED ORDER — ROSUVASTATIN CALCIUM 5 MG PO TABS: 5.0000 mg | ORAL_TABLET | Freq: Every day | ORAL | 1 refills | Status: DC

## 2023-11-26 MED ORDER — LEVOTHYROXINE SODIUM 50 MCG PO TABS: 50.0000 ug | ORAL_TABLET | Freq: Every day | ORAL | 1 refills | Status: DC
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
## 2024-04-20 ENCOUNTER — Encounter: Payer: Self-pay | Admitting: Family Medicine

## 2024-04-20 ENCOUNTER — Other Ambulatory Visit (HOSPITAL_COMMUNITY): Payer: Self-pay | Admitting: Family Medicine

## 2024-04-20 ENCOUNTER — Ambulatory Visit (INDEPENDENT_AMBULATORY_CARE_PROVIDER_SITE_OTHER): Payer: BC Managed Care – PPO | Admitting: Family Medicine

## 2024-04-20 VITALS — BP 104/62 | HR 70 | Resp 18 | Ht 63.0 in | Wt 114.1 lb

## 2024-04-20 DIAGNOSIS — K219 Gastro-esophageal reflux disease without esophagitis: Secondary | ICD-10-CM | POA: Diagnosis not present

## 2024-04-20 DIAGNOSIS — E039 Hypothyroidism, unspecified: Secondary | ICD-10-CM

## 2024-04-20 DIAGNOSIS — E559 Vitamin D deficiency, unspecified: Secondary | ICD-10-CM | POA: Diagnosis not present

## 2024-04-20 DIAGNOSIS — J3089 Other allergic rhinitis: Secondary | ICD-10-CM

## 2024-04-20 DIAGNOSIS — E785 Hyperlipidemia, unspecified: Secondary | ICD-10-CM

## 2024-04-20 DIAGNOSIS — F17210 Nicotine dependence, cigarettes, uncomplicated: Secondary | ICD-10-CM

## 2024-04-20 DIAGNOSIS — Z23 Encounter for immunization: Secondary | ICD-10-CM | POA: Insufficient documentation

## 2024-04-20 DIAGNOSIS — Z1231 Encounter for screening mammogram for malignant neoplasm of breast: Secondary | ICD-10-CM

## 2024-04-20 NOTE — Assessment & Plan Note (Signed)
 Updated lab needed at/ before next visit.

## 2024-04-20 NOTE — Assessment & Plan Note (Signed)
 Controlled, no change in medication

## 2024-04-20 NOTE — Progress Notes (Signed)
   Carrie Perez     MRN: [id number]      DOB: [birth year]  Chief Complaint  Patient presents with   Medical Management of Chronic Issues    6 month follow up     HPI Carrie Perez is here for follow up and re-evaluation of chronic medical conditions, medication management and review of any available recent lab and radiology data.  Preventive health is updated, specifically  Cancer screening and Immunization.   Questions or concerns regarding consultations or procedures which the PT has had in the interim are  addressed. The PT denies any adverse reactions to current medications since the last visit.  There are no new concerns.  There are no specific complaints   ROS Denies recent fever or chills. Denies sinus pressure, nasal congestion, ear pain or sore throat. Denies chest congestion, productive cough or wheezing. Denies chest pains, palpitations and leg swelling Denies abdominal pain, nausea, vomiting,diarrhea or constipation.   Denies dysuria, frequency, hesitancy or incontinence. Denies joint pain, swelling and limitation in mobility. Denies headaches, seizures, numbness, or tingling. Denies depression, anxiety or insomnia. Denies skin break down or rash.   PE  BP 104/62   Pulse 70   Resp 18   Ht 5\' 3"  (1.6 m)   Wt 114 lb 1.3 oz (51.7 kg)   LMP 11/30/2014   SpO2 98%   BMI 20.21 kg/m   Patient alert and oriented and in no cardiopulmonary distress.  HEENT: No facial asymmetry, EOMI,     Neck supple .  Chest: Clear to auscultation bilaterally.  CVS: S1, S2 no murmurs, no S3.Regular rate.  ABD: Soft non tender.   Ext: No edema  MS: Adequate ROM spine, shoulders, hips and knees.  Skin: Intact, no ulcerations or rash noted.  Psych: Good eye contact, normal affect. Memory intact not anxious or depressed appearing.  CNS: CN 2-12 intact, power,  normal throughout.no focal deficits noted.   Assessment & Plan

## 2024-04-20 NOTE — Assessment & Plan Note (Signed)
 After obtaining informed consent, the  Pneumonia 20 vaccine is  administered , with no adverse effect noted at the time of administration.

## 2024-04-20 NOTE — Assessment & Plan Note (Signed)
 Asked:confirms currently smokes cigarettes, 7/day for approx 25 years Assess: Unwilling to set a quit date, but is cutting back,  Advise: needs to QUIT to reduce risk of cancer, cardio and cerebrovascular disease Assist: counseled for 5 minutes and literature provided Arrange: follow up in 2 to 4 months

## 2024-04-20 NOTE — Assessment & Plan Note (Signed)
Managed by Endo and controlled 

## 2024-04-20 NOTE — Patient Instructions (Addendum)
 Annual exam 10/21/2024, call if you need me sooner  Need to stop smoking cigarettes, use the gum instead of cigarettes, this will reduce your risk of all cancers, as well as heart disease and stroke  Call [email or web address] for help  Pneumonia 20 today  It is important that you exercise regularly at least 30 minutes 5 times a week. If you develop chest pain, have severe difficulty breathing, or feel very tired, stop exercising immediately and seek medical attention     Please schedule mammogram at checkout  Fasting CBC, lipid, cmp and eGFR, Vit D to be ordered for draw 1 week before December appointment

## 2024-04-20 NOTE — Assessment & Plan Note (Signed)
 Hyperlipidemia:Low fat diet discussed and encouraged.   Lipid Panel  Lab Results  Component Value Date   CHOL 170 11/20/2023   HDL 65 11/20/2023   LDLCALC 94 11/20/2023   TRIG 55 11/20/2023   CHOLHDL 2.6 11/20/2023     Controlled, no change in medication

## 2024-05-18 DIAGNOSIS — E063 Autoimmune thyroiditis: Secondary | ICD-10-CM | POA: Diagnosis not present

## 2024-05-18 DIAGNOSIS — E782 Mixed hyperlipidemia: Secondary | ICD-10-CM | POA: Diagnosis not present

## 2024-05-19 LAB — LIPID PANEL
Chol/HDL Ratio: 3 ratio (ref 0.0–4.4)
Cholesterol, Total: 170 mg/dL (ref 100–199)
HDL: 56 mg/dL (ref >39)
LDL Chol Calc (NIH): 100 mg/dL — ABNORMAL HIGH (ref 0–99)
Triglycerides: 73 mg/dL (ref 0–149)
VLDL Cholesterol Cal: 14 mg/dL (ref 5–40)

## 2024-05-19 LAB — T4, FREE: Free T4: 1.21 ng/dL (ref 0.82–1.77)

## 2024-05-19 LAB — TSH: TSH: 2 u[IU]/mL (ref 0.450–4.500)

## 2024-05-25 ENCOUNTER — Encounter: Payer: Self-pay | Admitting: "Endocrinology

## 2024-05-25 ENCOUNTER — Ambulatory Visit (INDEPENDENT_AMBULATORY_CARE_PROVIDER_SITE_OTHER): Payer: BC Managed Care – PPO | Admitting: "Endocrinology

## 2024-05-25 VITALS — BP 88/60 | HR 72 | Ht 63.0 in | Wt 114.4 lb

## 2024-05-25 DIAGNOSIS — E063 Autoimmune thyroiditis: Secondary | ICD-10-CM

## 2024-05-25 DIAGNOSIS — E782 Mixed hyperlipidemia: Secondary | ICD-10-CM

## 2024-05-25 MED ORDER — ROSUVASTATIN CALCIUM 5 MG PO TABS: 5.0000 mg | ORAL_TABLET | Freq: Every day | ORAL | 1 refills | Status: AC

## 2024-05-25 MED ORDER — LEVOTHYROXINE SODIUM 50 MCG PO TABS: 50.0000 ug | ORAL_TABLET | Freq: Every day | ORAL | 1 refills | Status: DC
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
## 2024-06-16 ENCOUNTER — Other Ambulatory Visit: Payer: Self-pay | Admitting: Family Medicine

## 2024-08-10 ENCOUNTER — Ambulatory Visit (INDEPENDENT_AMBULATORY_CARE_PROVIDER_SITE_OTHER)

## 2024-08-10 DIAGNOSIS — Z23 Encounter for immunization: Secondary | ICD-10-CM

## 2024-08-10 NOTE — Progress Notes (Signed)
 Patient is in office today for a nurse visit for flu shot. Patient Injection was given in the  Right deltoid. Patient tolerated injection well.

## 2024-09-01 ENCOUNTER — Encounter (INDEPENDENT_AMBULATORY_CARE_PROVIDER_SITE_OTHER): Payer: Self-pay | Admitting: Gastroenterology

## 2024-10-11 ENCOUNTER — Ambulatory Visit (HOSPITAL_COMMUNITY)
Admission: RE | Admit: 2024-10-11 | Discharge: 2024-10-11 | Disposition: A | Discharge status: Home / Self Care | Source: Ambulatory Visit | Attending: Family Medicine | Admitting: Family Medicine

## 2024-10-11 DIAGNOSIS — Z1231 Encounter for screening mammogram for malignant neoplasm of breast: Secondary | ICD-10-CM | POA: Insufficient documentation

## 2024-10-18 ENCOUNTER — Ambulatory Visit: Admitting: Family Medicine

## 2024-10-18 ENCOUNTER — Encounter: Payer: Self-pay | Admitting: Family Medicine

## 2024-10-18 VITALS — BP 126/73 | HR 70 | Ht 62.0 in | Wt 113.0 lb

## 2024-10-18 DIAGNOSIS — J0100 Acute maxillary sinusitis, unspecified: Secondary | ICD-10-CM | POA: Insufficient documentation

## 2024-10-18 MED ORDER — DOXYCYCLINE HYCLATE 100 MG PO TABS: 100.0000 mg | ORAL_TABLET | Freq: Two times a day (BID) | ORAL | 0 refills | Status: AC

## 2024-10-18 NOTE — Assessment & Plan Note (Signed)
 Start taking doxycycline  BID for 5 days for sinusitis. Increase fluid intake and allow for plenty of rest. Take Tylenol as needed for pain, fever, or general discomfort. Perform warm saltwater gargles 3-4 times daily to help with throat pain or discomfort. (Mix 1/2 teaspoon of salt in a glass of warm water  and gargle several times daily to reduce throat inflammation and soothe irritation.) Use a humidifier at bedtime to help with cough and nasal congestion. For nasal congestion: The use of heated, humidified air is a safe and effective therapy. Saline nasal sprays may also help alleviate nasal symptoms of the common cold. For cough: Treatment with honey may reduce cough frequency and severity. Follow up if your symptoms do not improve after 7 to 10 days from symptom onset.

## 2024-10-18 NOTE — Patient Instructions (Addendum)
 I appreciate the opportunity to provide care to you today!    Sinusitis:  Start taking doxycycline  BID for 5 days for sinusitis. Increase fluid intake and allow for plenty of rest. Take Tylenol as needed for pain, fever, or general discomfort. Perform warm saltwater gargles 3-4 times daily to help with throat pain or discomfort. (Mix 1/2 teaspoon of salt in a glass of warm water  and gargle several times daily to reduce throat inflammation and soothe irritation.) Use a humidifier at bedtime to help with cough and nasal congestion. For nasal congestion: The use of heated, humidified air is a safe and effective therapy. Saline nasal sprays may also help alleviate nasal symptoms of the common cold. For cough: Treatment with honey may reduce cough frequency and severity. Follow up if your symptoms do not improve after 7 to 10 days from symptom onset.    Please continue to a heart-healthy diet and increase your physical activities. Try to exercise for at least five days a week.    It was a pleasure to see you and I look forward to continuing to work together on your health and well-being. Please do not hesitate to call the office if you need care or have questions about your care.  In case of emergency, please visit the Emergency Department for urgent care, or contact our clinic at [phone] to schedule an appointment. We're here to help you!   Have a wonderful day and week. With Gratitude, Meade JENEANE Gerlach MSN, FNP-BC, PMHNP-BC
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
## 2024-10-22 ENCOUNTER — Ambulatory Visit (INDEPENDENT_AMBULATORY_CARE_PROVIDER_SITE_OTHER): Admitting: Family Medicine

## 2024-10-22 ENCOUNTER — Encounter: Payer: Self-pay | Admitting: Family Medicine

## 2024-10-22 VITALS — BP 109/65 | HR 70 | Resp 18 | Ht 62.0 in | Wt 116.6 lb

## 2024-10-22 DIAGNOSIS — Z0000 Encounter for general adult medical examination without abnormal findings: Secondary | ICD-10-CM

## 2024-10-22 DIAGNOSIS — F17210 Nicotine dependence, cigarettes, uncomplicated: Secondary | ICD-10-CM

## 2024-10-22 DIAGNOSIS — E559 Vitamin D deficiency, unspecified: Secondary | ICD-10-CM | POA: Diagnosis not present

## 2024-10-22 MED ORDER — BETAMETHASONE DIPROPIONATE 0.05 % EX CREA: TOPICAL_CREAM | Freq: Two times a day (BID) | CUTANEOUS | 1 refills | Status: AC

## 2024-10-22 NOTE — Patient Instructions (Addendum)
 Annual with Meade in 1 year  Medication sent for rash  Please get fasting labs Jan 2 or 3 along with labs for Dr Lenis  NURSE PLS ORDER cbc AND VIT D LEVEL  You are up to date on all cancer screening so that is good  Keep up great health habits  Best to you and your family for the Season and 2026!  Thanks for choosing [facility], we consider it a privelige to serve you.

## 2024-10-22 NOTE — Progress Notes (Signed)
    Carrie Perez     MRN: [id number]      DOB: 1961-01-16  Chief Complaint  Patient presents with   Annual Exam    Cpe     HPI: Patient is in for annual physical exam. C/o flare of rash behind left ear , wants medication refilled for this Immunization is reviewed , and  updated if needed.   PE: BP 109/65   Pulse 70   Resp 18   Ht 5' 2 (1.575 m)   Wt 116 lb 9.6 oz (52.9 kg)   LMP 11/30/2014   SpO2 99%   BMI 21.33 kg/m   Pleasant  female, alert and oriented x 3, in no cardio-pulmonary distress. Afebrile. HEENT No facial trauma or asymetry. Sinuses non tender.  Extra occullar muscles intact.. External ears normal, . Neck: supple, no adenopathy,JVD or thyromegaly.No bruits.  Chest: Clear to ascultation bilaterally.No crackles or wheezes. Non tender to palpation  Breast: Asymptomatic, mammogram uTD, not examined  Cardiovascular system; Heart sounds normal,  S1 and  S2 ,no S3.  No murmur, or thrill. Apical beat not displaced Peripheral pulses normal.  Abdomen: Soft, non tender, no organomegaly or masses. No bruits. Bowel sounds normal. No guarding, tenderness or rebound.    Musculoskeletal exam: Full ROM of spine, hips , shoulders and knees. No deformity ,swelling or crepitus noted. No muscle wasting or atrophy.   Neurologic: Cranial nerves 2 to 12 intact. Power, tone ,sensation and reflexes normal throughout. No disturbance in gait. No tremor.  Skin: Intact, no ulceration, erythema , scaling or rash noted. Pigmentation normal throughout  Psych; Normal mood and affect. Judgement and concentration normal   Assessment & Plan:  Encounter for annual physical exam Annual exam as documented. Counseling done  re healthy lifestyle involving commitment to 150 minutes exercise per week, heart healthy diet, and attaining healthy weight.The importance of adequate sleep also discussed.  Immunization and cancer screening needs are specifically addressed at  this visit.   Cigarette smoker Asked:confirms currently smokes cigarettes 7 /day Assess: Unwilling to set a quit date, but is cutting back Advise: needs to QUIT to reduce risk of cancer, cardio and cerebrovascular disease Assist: counseled for 5 minutes and literature provided Arrange: follow up in 2 to 4 months

## 2024-10-24 ENCOUNTER — Encounter: Payer: Self-pay | Admitting: Family Medicine

## 2024-10-24 NOTE — Assessment & Plan Note (Signed)
Asked:confirms currently smokes cigarettes 7/day Assess: Unwilling to set a quit date, but is cutting back Advise: needs to QUIT to reduce risk of cancer, cardio and cerebrovascular disease Assist: counseled for 5 minutes and literature provided Arrange: follow up in 2 to 4 months  

## 2024-10-24 NOTE — Assessment & Plan Note (Signed)
 Annual exam as documented. Counseling done  re healthy lifestyle involving commitment to 150 minutes exercise per week, heart healthy diet, and attaining healthy weight.The importance of adequate sleep also discussed.  Immunization and cancer screening needs are specifically addressed at this visit.

## 2024-11-12 ENCOUNTER — Ambulatory Visit: Payer: Self-pay

## 2024-11-12 ENCOUNTER — Other Ambulatory Visit: Payer: Self-pay | Admitting: Internal Medicine

## 2024-11-12 DIAGNOSIS — J0100 Acute maxillary sinusitis, unspecified: Secondary | ICD-10-CM

## 2024-11-12 MED ORDER — DOXYCYCLINE HYCLATE 100 MG PO TABS: 100.0000 mg | ORAL_TABLET | Freq: Two times a day (BID) | ORAL | 0 refills | Status: AC

## 2024-11-12 NOTE — Telephone Encounter (Signed)
 FYI Only or Action Required?: Action required by provider: update on patient condition.  Patient was last seen in primary care on 10/22/2024 by Carrie Perez BRAVO, MD.  Called Nurse Triage reporting Facial Swelling.  Symptoms began today.  Interventions attempted: Nothing.  Symptoms are: stable.  Triage Disposition: Home Care  Patient/caregiver understands and will follow disposition?:   Copied from CRM #[id number]. Topic: Clinical - Red Word Triage >> Nov 12, 2024  9:40 AM Tiffini S wrote: [facility] that prompted transfer to Nurse Triage: Patient was given a antibiotic from pcp- face is swollen over night/  up under the nose- had a tooth absence Reason for Disposition  MILD face swelling of unknown cause  Answer Assessment - Initial Assessment Questions 1. ONSET: When did the swelling start? (e.g., minutes, hours, days)     Swelling came back this morning on left side of face.  Swelling start 3 weeks ago. Saw [employer] and got doxycycline  and swelling went down; returned this morning. Patient says abscess is gone. She is requesting more antibiotics. Told patient office closes at 12 today and may need to go to UC.  2. LOCATION: What part of the face is swollen? (e.g., cheek, entire face, jaw joint area, under jaw) From left eye down to chin 4. ITCHING: Is there any itching? If Yes, ask: How much?   (Scale 1-10; mild, moderate or severe)     Denies 5. PAIN: Is the swelling painful to touch? If Yes, ask: How painful is it?   (Scale 0-10; mild, moderate or severe)     Denies 6. FEVER: Do you have a fever? If Yes, ask: What is it, how was it measured, and when did it start?      Denies 7. CAUSE: What do you think is causing the face swelling?     unsure 10. OTHER SYMPTOMS: Do you have any other symptoms? (e.g., leg swelling, toothache)       Denies  Protocols used: Face Swelling-A-AH

## 2024-11-24 ENCOUNTER — Ambulatory Visit: Payer: Self-pay | Admitting: Family Medicine

## 2024-11-24 LAB — COMPREHENSIVE METABOLIC PANEL WITH GFR
ALT: 9 IU/L (ref 0–32)
AST: 19 IU/L (ref 0–40)
Albumin: 4.5 g/dL (ref 3.9–4.9)
Alkaline Phosphatase: 90 IU/L (ref 49–135)
BUN/Creatinine Ratio: 20 (ref 12–28)
BUN: 16 mg/dL (ref 8–27)
Bilirubin Total: 0.5 mg/dL (ref 0.0–1.2)
CO2: 24 mmol/L (ref 20–29)
Calcium: 10.6 mg/dL — ABNORMAL HIGH (ref 8.7–10.3)
Chloride: 99 mmol/L (ref 96–106)
Creatinine, Ser: 0.81 mg/dL (ref 0.57–1.00)
Globulin, Total: 2.9 g/dL (ref 1.5–4.5)
Glucose: 94 mg/dL (ref 70–99)
Potassium: 4.6 mmol/L (ref 3.5–5.2)
Sodium: 140 mmol/L (ref 134–144)
Total Protein: 7.4 g/dL (ref 6.0–8.5)
eGFR: 83 mL/min/1.73

## 2024-11-24 LAB — CBC WITH DIFFERENTIAL/PLATELET
Basophils Absolute: 0 x10E3/uL (ref 0.0–0.2)
Basos: 0 %
EOS (ABSOLUTE): 0.1 x10E3/uL (ref 0.0–0.4)
Eos: 1 %
Hematocrit: 38.6 % (ref 34.0–46.6)
Hemoglobin: 12.4 g/dL (ref 11.1–15.9)
Immature Grans (Abs): 0 x10E3/uL (ref 0.0–0.1)
Immature Granulocytes: 0 %
Lymphocytes Absolute: 1.3 x10E3/uL (ref 0.7–3.1)
Lymphs: 31 %
MCH: 29.7 pg (ref 26.6–33.0)
MCHC: 32.1 g/dL (ref 31.5–35.7)
MCV: 93 fL (ref 79–97)
Monocytes Absolute: 0.2 x10E3/uL (ref 0.1–0.9)
Monocytes: 4 %
Neutrophils Absolute: 2.7 x10E3/uL (ref 1.4–7.0)
Neutrophils: 64 %
Platelets: 237 x10E3/uL (ref 150–450)
RBC: 4.17 x10E6/uL (ref 3.77–5.28)
RDW: 13.9 % (ref 11.7–15.4)
WBC: 4.3 x10E3/uL (ref 3.4–10.8)

## 2024-11-24 LAB — T4, FREE: Free T4: 1.14 ng/dL (ref 0.82–1.77)

## 2024-11-24 LAB — LIPID PANEL
Chol/HDL Ratio: 3.3 ratio (ref 0.0–4.4)
Cholesterol, Total: 222 mg/dL — ABNORMAL HIGH (ref 100–199)
HDL: 67 mg/dL
LDL Chol Calc (NIH): 143 mg/dL — ABNORMAL HIGH (ref 0–99)
Triglycerides: 69 mg/dL (ref 0–149)
VLDL Cholesterol Cal: 12 mg/dL (ref 5–40)

## 2024-11-24 LAB — CORTISOL-AM, BLOOD: Cortisol - AM: 6.9 ug/dL (ref 6.2–19.4)

## 2024-11-24 LAB — VITAMIN D 25 HYDROXY (VIT D DEFICIENCY, FRACTURES): Vit D, 25-Hydroxy: 24.8 ng/mL — ABNORMAL LOW (ref 30.0–100.0)

## 2024-11-24 LAB — TSH: TSH: 2.57 u[IU]/mL (ref 0.450–4.500)

## 2024-11-24 MED ORDER — OYSTER SHELL CALCIUM/D3 500-5 MG-MCG PO TABS: 1.0000 | ORAL_TABLET | Freq: Two times a day (BID) | ORAL | 3 refills | Status: AC

## 2024-11-25 ENCOUNTER — Other Ambulatory Visit: Payer: Self-pay | Admitting: "Endocrinology

## 2024-11-25 ENCOUNTER — Telehealth: Payer: Self-pay | Admitting: "Endocrinology

## 2024-11-25 ENCOUNTER — Ambulatory Visit: Admitting: "Endocrinology

## 2024-11-25 MED ORDER — LEVOTHYROXINE SODIUM 50 MCG PO TABS: 50.0000 ug | ORAL_TABLET | Freq: Every day | ORAL | 0 refills | Status: AC

## 2024-11-25 NOTE — Telephone Encounter (Signed)
 Pt came in and stated that her insurance was the same, insurance was showing inactive.  Pt did not have an insurance card, told pt I could reschedule her and give her time to call insurance.  She stated she did not want to reschedule at this time and would call back.  Pt also asked if we could refill her medicine.  Informed pt to have pharmacy send in request and dr could prob do a limited amount to get through until her appt

## 2024-11-26 NOTE — Telephone Encounter (Signed)
 Left a message requesting pt return call to the office.

## 2024-11-30 ENCOUNTER — Telehealth: Payer: Self-pay | Admitting: "Endocrinology

## 2024-11-30 ENCOUNTER — Other Ambulatory Visit: Payer: Self-pay | Admitting: "Endocrinology

## 2024-11-30 DIAGNOSIS — E063 Autoimmune thyroiditis: Secondary | ICD-10-CM

## 2024-11-30 NOTE — Telephone Encounter (Signed)
 Pt had to move appt, does she need to repeat blood work?  If so put orders in please

## 2024-11-30 NOTE — Telephone Encounter (Signed)
 Mailed appt reminder and lab order to pt

## 2024-12-03 ENCOUNTER — Telehealth: Payer: Self-pay | Admitting: Family Medicine

## 2024-12-03 ENCOUNTER — Encounter: Payer: Self-pay | Admitting: Family Medicine

## 2024-12-03 NOTE — Telephone Encounter (Signed)
 FMLA forms Noted Copied Sleeved Original placed provider box (Dr Antonetta) Copy placed front desk front folder  Patient needs by Tuesday, 1/20 if at all possible, transferring mom home with her and needs to get these papers to work work by Tuesday to keep her job and get this taken care of.

## 2024-12-09 NOTE — Telephone Encounter (Signed)
 Placed in providers box for signature 01/20 am. Still awaiting signature

## 2024-12-10 NOTE — Telephone Encounter (Signed)
 Carrie Perez (best friend) picked up forms

## 2024-12-10 NOTE — Telephone Encounter (Signed)
 Completed, copied. Copy placed up front, lvm informing

## 2025-01-19 ENCOUNTER — Ambulatory Visit: Admitting: "Endocrinology

## 2025-11-01 ENCOUNTER — Encounter: Admitting: Family Medicine
